# Patient Record
Sex: Female | Born: 1935 | Race: White | Hispanic: No | Marital: Married | Smoking: Never smoker
Health system: Southern US, Community
[De-identification: ages and names within clinical notes are randomized; demographics above are authoritative.]

## PROBLEM LIST (undated history)

## (undated) DIAGNOSIS — J449 Chronic obstructive pulmonary disease, unspecified: Secondary | ICD-10-CM

## (undated) DIAGNOSIS — I209 Angina pectoris, unspecified: Secondary | ICD-10-CM

## (undated) DIAGNOSIS — K219 Gastro-esophageal reflux disease without esophagitis: Secondary | ICD-10-CM

## (undated) DIAGNOSIS — D649 Anemia, unspecified: Secondary | ICD-10-CM

## (undated) HISTORY — DX: Chronic obstructive pulmonary disease, unspecified: J44.9

## (undated) HISTORY — DX: Gastro-esophageal reflux disease without esophagitis: K21.9

---

## 1989-10-09 HISTORY — PX: OTHER SURGICAL HISTORY: SHX169

## 1990-10-09 HISTORY — PX: CHOLECYSTECTOMY: SHX55

## 1993-10-09 HISTORY — PX: OTHER SURGICAL HISTORY: SHX169

## 2006-02-06 ENCOUNTER — Inpatient Hospital Stay: Payer: Self-pay | Admitting: Endocrinology

## 2006-02-06 ENCOUNTER — Other Ambulatory Visit: Payer: Self-pay

## 2006-02-07 ENCOUNTER — Other Ambulatory Visit: Payer: Self-pay

## 2006-03-06 ENCOUNTER — Ambulatory Visit: Payer: Self-pay | Admitting: Internal Medicine

## 2006-06-08 ENCOUNTER — Ambulatory Visit: Payer: Self-pay | Admitting: Internal Medicine

## 2008-01-20 ENCOUNTER — Emergency Department: Payer: Self-pay | Admitting: Emergency Medicine

## 2013-01-10 ENCOUNTER — Ambulatory Visit: Payer: Self-pay | Admitting: Endocrinology

## 2014-04-29 LAB — CBC
HCT: 41.7 % (ref 35.0–47.0)
HGB: 13.4 g/dL (ref 12.0–16.0)
MCH: 29.1 pg (ref 26.0–34.0)
MCHC: 32.1 g/dL (ref 32.0–36.0)
MCV: 91 fL (ref 80–100)
Platelet: 256 10*3/uL (ref 150–440)
RBC: 4.6 10*6/uL (ref 3.80–5.20)
RDW: 16.2 % — AB (ref 11.5–14.5)
WBC: 11.9 10*3/uL — ABNORMAL HIGH (ref 3.6–11.0)

## 2014-04-29 LAB — BASIC METABOLIC PANEL
Anion Gap: 9 (ref 7–16)
BUN: 9 mg/dL (ref 7–18)
Calcium, Total: 9 mg/dL (ref 8.5–10.1)
Chloride: 106 mmol/L (ref 98–107)
Co2: 24 mmol/L (ref 21–32)
Creatinine: 1.24 mg/dL (ref 0.60–1.30)
EGFR (African American): 46 — ABNORMAL LOW
GFR CALC NON AF AMER: 40 — AB
Glucose: 138 mg/dL — ABNORMAL HIGH (ref 65–99)
Osmolality: 278 (ref 275–301)
Potassium: 3.7 mmol/L (ref 3.5–5.1)
Sodium: 139 mmol/L (ref 136–145)

## 2014-04-29 LAB — PRO B NATRIURETIC PEPTIDE: B-Type Natriuretic Peptide: 90 pg/mL (ref 0–450)

## 2014-04-29 LAB — TROPONIN I

## 2014-04-30 ENCOUNTER — Observation Stay: Payer: Self-pay | Admitting: Specialist

## 2014-04-30 LAB — CK TOTAL AND CKMB (NOT AT ARMC)
CK, Total: 24 U/L — ABNORMAL LOW
CK, Total: 27 U/L
CK-MB: 0.6 ng/mL (ref 0.5–3.6)
CK-MB: <0.5 ng/mL — ABNORMAL LOW (ref 0.5–3.6)

## 2014-04-30 LAB — CBC WITH DIFFERENTIAL/PLATELET
Basophil #: 0.1 10*3/uL (ref 0.0–0.1)
Basophil %: 0.6 %
Eosinophil #: 0.2 10*3/uL (ref 0.0–0.7)
Eosinophil %: 1.4 %
HCT: 35.5 % (ref 35.0–47.0)
HGB: 11.6 g/dL — ABNORMAL LOW (ref 12.0–16.0)
LYMPHS ABS: 3.2 10*3/uL (ref 1.0–3.6)
LYMPHS PCT: 27.9 %
MCH: 29.4 pg (ref 26.0–34.0)
MCHC: 32.6 g/dL (ref 32.0–36.0)
MCV: 90 fL (ref 80–100)
MONO ABS: 0.9 x10 3/mm (ref 0.2–0.9)
MONOS PCT: 7.8 %
Neutrophil #: 7.2 10*3/uL — ABNORMAL HIGH (ref 1.4–6.5)
Neutrophil %: 62.3 %
PLATELETS: 225 10*3/uL (ref 150–440)
RBC: 3.93 10*6/uL (ref 3.80–5.20)
RDW: 16.3 % — AB (ref 11.5–14.5)
WBC: 11.5 10*3/uL — ABNORMAL HIGH (ref 3.6–11.0)

## 2014-04-30 LAB — TSH: Thyroid Stimulating Horm: 1.62 u[IU]/mL

## 2014-04-30 LAB — URINALYSIS, COMPLETE
Bilirubin,UR: NEGATIVE
Blood: NEGATIVE
Glucose,UR: NEGATIVE mg/dL (ref 0–75)
Ketone: NEGATIVE
Leukocyte Esterase: NEGATIVE
NITRITE: NEGATIVE
Ph: 5 (ref 4.5–8.0)
Protein: NEGATIVE
RBC,UR: 5 /HPF (ref 0–5)
Squamous Epithelial: 3

## 2014-04-30 LAB — BASIC METABOLIC PANEL
ANION GAP: 7 (ref 7–16)
BUN: 11 mg/dL (ref 7–18)
Calcium, Total: 8.4 mg/dL — ABNORMAL LOW (ref 8.5–10.1)
Chloride: 108 mmol/L — ABNORMAL HIGH (ref 98–107)
Co2: 26 mmol/L (ref 21–32)
Creatinine: 1.16 mg/dL (ref 0.60–1.30)
EGFR (African American): 50 — ABNORMAL LOW
EGFR (Non-African Amer.): 43 — ABNORMAL LOW
GLUCOSE: 114 mg/dL — AB (ref 65–99)
Osmolality: 282 (ref 275–301)
Potassium: 4 mmol/L (ref 3.5–5.1)
Sodium: 141 mmol/L (ref 136–145)

## 2014-04-30 LAB — TROPONIN I: Troponin-I: <0.02 ng/mL

## 2014-04-30 LAB — MAGNESIUM: MAGNESIUM: 1.9 mg/dL

## 2014-10-05 ENCOUNTER — Emergency Department: Payer: Self-pay | Admitting: Emergency Medicine

## 2014-10-06 LAB — COMPREHENSIVE METABOLIC PANEL
ALK PHOS: 68 U/L
ALT: 14 U/L
Albumin: 2.6 g/dL — ABNORMAL LOW (ref 3.4–5.0)
Anion Gap: 7 (ref 7–16)
BUN: 9 mg/dL (ref 7–18)
Bilirubin,Total: 0.5 mg/dL (ref 0.2–1.0)
CREATININE: 0.97 mg/dL (ref 0.60–1.30)
Calcium, Total: 8.2 mg/dL — ABNORMAL LOW (ref 8.5–10.1)
Chloride: 110 mmol/L — ABNORMAL HIGH (ref 98–107)
Co2: 24 mmol/L (ref 21–32)
GFR CALC NON AF AMER: 58 — AB
GLUCOSE: 101 mg/dL — AB (ref 65–99)
Osmolality: 280 (ref 275–301)
POTASSIUM: 3.7 mmol/L (ref 3.5–5.1)
SGOT(AST): 15 U/L (ref 15–37)
Sodium: 141 mmol/L (ref 136–145)
Total Protein: 6.3 g/dL — ABNORMAL LOW (ref 6.4–8.2)

## 2014-10-06 LAB — CBC
HCT: 37.1 % (ref 35.0–47.0)
HGB: 12 g/dL (ref 12.0–16.0)
MCH: 29.2 pg (ref 26.0–34.0)
MCHC: 32.3 g/dL (ref 32.0–36.0)
MCV: 91 fL (ref 80–100)
Platelet: 237 10*3/uL (ref 150–440)
RBC: 4.1 10*6/uL (ref 3.80–5.20)
RDW: 15.3 % — ABNORMAL HIGH (ref 11.5–14.5)
WBC: 10.8 10*3/uL (ref 3.6–11.0)

## 2015-01-30 NOTE — Consult Note (Signed)
PATIENT NAME:  Brittney Trujillo, Maecie L MR#:  [id number] DATE OF BIRTH:  Oct 19, 1926  DATE OF CONSULTATION:  04/30/2014  CONSULTING PHYSICIAN:  Laurier NancyShaukat A. Khan, MD  INDICATION FOR CONSULTATION: Palpitations.   HISTORY OF PRESENT ILLNESS: The patient is an 81 year old white female with a past medical history of hypertension, hyperlipidemia, diabetes mellitus, who presented to the Emergency Room after being seen Dr. Anabel HalonMoriarty's office with palpitations, dizziness, and an episode where she felt like she was going to pass out. She has had this type of episode for the past couple of months and she says even for a few years but lately, the day before yesterday, these symptoms got worse.  She felt like initially that she was having hypoglycemic episodes but the day before yesterday she felt she was going to pass out after having palpitations.   PAST MEDICAL HISTORY: History of COPD, diabetes, hypertension, hyperlipidemia.   SOCIAL HISTORY: No history of EtOH abuse or smoking.   FAMILY HISTORY: Unremarkable.   ALLERGIES: CODEINE.   MEDICATIONS: Aspirin 81 mg, Premarin, Glucotrol, Clarinex, Caduet.    PHYSICAL EXAMINATION:  GENERAL:  Temperature is 98.1, pulse 75, respirations 18, blood pressure is 116/72, with oxygen saturation 94%.  HEENT: No JVD.  LUNGS: Good air entry. No rales or rhonchi.  HEART: Regular rate and rhythm. Normal S1, S2. No audible murmur.  ABDOMEN: Soft, nontender, positive bowel sounds.  EXTREMITIES: No pedal edema.  NEUROLOGIC: The patient appears to be intact.   EKG initially was done at Dr. Anabel HalonMoriarty's office, which showed sinus rhythm with some sinus arrhythmias. EKG done in this hospital showed sinus rhythm, 71 beats per minute, poor R-wave progression, low voltage, nonspecific ST-T changes. Poor R-wave progression suggests old anteroseptal wall MI.    LABORATORY DATA:  BUN 9, creatinine 1.24, BNP is 90, troponin is 0.02. Cardiac enzymes x 3 have been done and they were negative.    ASSESSMENT AND PLAN: The patient had a presyncopal episode with occasional palpitations. She has multiple risk factors for coronary artery disease including hypertension, diabetes, hyperlipidemia. 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 Advise getting an echocardiogram and probably will need outpatient or inpatient Lexiscan Myoview to rule out coronary artery disease. Agree with current treatment that the patient is on: Aspirin, Lipitor 20 mg, Protonix. Echocardiogram is pending.    ____________________________ Laurier NancyShaukat A. Khan, MD sak:lt D: 04/30/2014 09:07:00 ET T: 04/30/2014 09:26:35 ET JOB#: [id number]  cc: Laurier NancyShaukat A. Khan, MD, <Dictator> Laurier NancySHAUKAT A KHAN MD ELECTRONICALLY SIGNED 05/14/2014 12:02

## 2015-01-30 NOTE — H&P (Signed)
PATIENT NAME:  Brittney Trujillo, Brittney Trujillo MR#:  [id number] DATE OF BIRTH:  1926/12/16  DATE OF ADMISSION:  04/30/2014  REFERRING PHYSICIAN:  Dr. Governor Rooksebecca Lord.   PRIMARY CARE PHYSICIAN:  Dr. Patrecia PaceMorayati.   CARDIOLOGY:  The patient had an echo done in the past by Dr. Welton FlakesKhan.   CHIEF COMPLAINT:  Palpitations.   HISTORY OF PRESENT ILLNESS:  This is an 81 year old female with known history of COPD, hypertension, hyperlipidemia and diabetes mellitus, presents with palpitations, the patient reports she had these episodes, very brief, intermittent in the past few months or a few weeks reports, but reports recently they are becoming more frequent, which prompted her to go to see her PCP, as well reports having some occasional episodes of shortness of breath, mainly exertional, at PCP's office the patient had an EKG done which did show her going with intermittent sinus arrhythmia with occasional missing P waves, unclear if this is related to A. Fib or not.  She does not carry a diagnosis of A. Fib in the past, upon presentation in the ED, the patient had a couple of episodes of shortness of breath, where she received albuterol treatment, where she went sinus tachycardic, the patient had CT chest angiogram which was negative for PE, she denies any chest pain, any focal deficits, any altered mental status or confusion.  No nausea.  No vomiting, hospitalist service requested to admit the patient for further evaluation of her palpitations, so far, on telemetry there is no arrhythmias, no A. Fib on her telemetry.  In the ED, she only received albuterol in the Emergency Department.  Her EKG showing inverted T waves in the lateral leads.  There is no EKG at baseline to compare.  The patient denies fever, chills, sweating, chest pain, chest discomfort, hemoptysis, calf tenderness.  Reports her shortness of breath apparently only upon exertion.   PAST MEDICAL HISTORY: 1.  COPD.  2.  Diabetes.  3.  Hypertension.  4.  Hyperlipidemia.    PAST SURGICAL HISTORY:  Hysterectomy.   SOCIAL HISTORY:  The patient lives at home with her husband who is on hospice for cancer.  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  No history of alcohol or illicit drug use.   FAMILY HISTORY:  Denies any history of coronary artery disease in the family.   ALLERGIES:  CODEINE.   HOME MEDICATIONS: 1.  Glucotrol one time a day.  2.  Aspirin 81 mg daily.  3.  Clarinex 5 mg oral daily.  4.  Caduet 1 mg oral daily.  5.  Esomeprazole 40 mg oral twice daily.  6.  Sublingual nitro as needed.  7.  Premarin 0.625 mg oral daily.  8.  Calcium with vitamin D 1 tablet oral daily.   REVIEW OF SYSTEMS: CONSTITUTIONAL:  The patient denies fever, chills.  Reports generalized weakness and fatigue.  EYES:  Denies blurring vision, double vision, inflammation.  EARS, NOSE, THROAT:  Denies tinnitus, ear pain, hearing loss, epistaxis.  RESPIRATORY:  Denies cough, wheezing, hemoptysis.  Reports exertional shortness of breath and COPD.  CARDIOVASCULAR:  Denies chest pain, orthopnea, edema, syncope.  Reports palpitation.  GASTROINTESTINAL:  Denies nausea, vomiting, diarrhea, abdominal pain, hematemesis.  GENITOURINARY:  Denies dysuria, hematuria, renal colic.  ENDOCRINE:  Denies polyuria, polydipsia, heat or cold intolerance.  HEMATOLOGY:  Denies anemia, easy bruising, bleeding diathesis.  INTEGUMENT:  Denies acne, rash or skin lesion.  MUSCULOSKELETAL:  Denies any swelling, gout, arthritis, cramps.  NEUROLOGIC:  Denies CVA, TIA, tremors, migraine headache.  Reports some dizziness.  PSYCHIATRIC:  Denies anxiety, insomnia, or depression.   PHYSICAL EXAMINATION: VITAL SIGNS:  Temperature 97.8, pulse 88, respiratory rate 18, blood pressure 130/92, saturating 97% on room air.  GENERAL:  Well-nourished female who looks comfortable in bed, in no apparent distress.  HEENT:  Head  atraumatic, normocephalic.  Pupils equal, reactive to light.  Pink conjunctivae.  Anicteric sclerae.  Moist oral mucosa.  NECK:  Supple.  No thyromegaly.  No JVD.  CHEST:  Good air entry bilaterally.  No wheezing, rales, rhonchi.  CARDIOVASCULAR:  S1, S2 heard.  No rubs, murmur or gallops.  ABDOMEN:  Soft, nontender, nondistended.  Bowel sounds present.  EXTREMITIES:  No edema.  No clubbing.  No cyanosis.  Pedal pulses +2 bilaterally.  PSYCHIATRIC:  Appropriate affect.  Awake, alert x 3.  Intact judgment and insight.  NEUROLOGIC:  Cranial nerves grossly intact.  Motor 5 out of 5.  No focal deficits.  MUSCULOSKELETAL:  No joint effusion or erythema.   PERTINENT LABORATORY DATA:  CT chest angiogram for PE showing negative for pulmonary embolism or acute cardiopulmonary disease, hiatal hernia, fatty infiltration of the liver and status post cholecystectomy.   ASSESSMENT AND PLAN: 1.  Palpitations, at this point etiology is unclear, her EKG at her primary care office showing occasional sinus arrhythmias with missing P waves, but they are not constant enough to evaluate for atrial fibrillation.  So far, there is no evidence of arrhythmia on EKG.  There is no evidence of arrhythmia on telemetry monitor, the patient will be admitted for further evaluation.  We will admit her to telemetry unit.  We will check echocardiogram.  We will cycle her cardiac enzymes, follow the trend and we will consult cardiology service to see if she will need to have Holter monitor as an outpatient, as well to have them evaluate the EKG at her primary care office as well.  Would hold on starting any beta blockers until she is evaluated by cardiology.  2.  Hypertension.  Blood pressure is acceptable.  Continue with Norvasc.  3.  Chronic obstructive pulmonary disease.  Has no active wheezing.  We will start her on as needed Xopenex.  4.  Hyperlipidemia.  Continue with statin.  5.  Diabetes mellitus.  We will have her on insulin  sliding scale.  6.  Deep vein thrombosis prophylaxis.  SubQ heparin.  7.  CODE STATUS:  THE PATIENT REPORTS SHE IS DO NOT RESUSCITATE.  Reports she had a good life and is the [facility] Poll[facility] is coming to take her, she is ready for it.   Total time spent on admission and patient care 55 minutes.     ____________________________ Starleen Arms, MD dse:ea D: 04/29/2014 23:56:08 ET T: 04/30/2014 00:53:49 ET JOB#: [id number]  cc: Starleen Arms, MD, <Dictator> Jusiah Aguayo Teena Irani MD ELECTRONICALLY SIGNED 05/01/2014 1:31

## 2015-01-30 NOTE — Discharge Summary (Signed)
PATIENT NAME:  Brittney Trujillo, Brittney Trujillo MR#:  [id number] DATE OF BIRTH:  [birth year]  DATE OF ADMISSION:  04/30/2014 DATE OF DISCHARGE:  04/30/2014  For detailed note, please see the history and physical done on admission by Dr.   Randol KernElgergawy.  DIAGNOSES AT DISCHARGE: Chest pain, likely musculoskeletal in nature, palpitations, likely sinus arrhythmia. Hypertension. Hyperlipidemia.   DIET: The patient is being discharged on a low-sodium, low-fat diet.   ACTIVITY: As tolerated.   FOLLOW-UP: With Dr. Adrian BlackwaterShaukat Khan in the next 1 to 2 weeks.    DISCHARGE MEDICATIONS: Sublingual nitroglycerin as needed, Premarin 0.625 mg daily, Nexium 40 mg b.i.d., aspirin 81 mg daily, Clarinex 5 mg daily, calcium and vitamin D 1 tablet daily, Caduet 1 tablet daily.   CONSULTANTS DURING HOSPITAL COURSE: Dr. Adrian BlackwaterShaukat Khan from cardiology.   PERTINENT STUDIES DONE DURING THE HOSPITAL COURSE: CT scan of the chest done with contrast showing negative study for pulmonary embolism, hiatal hernia, fatty infiltration of the liver status post cholecystectomy.   Chest x-ray showing cardiomegaly, no CHF, low lung volumes and mild atelectasis.   HOSPITAL COURSE: This is an 81 year old female who presented to the hospital with palpitations and chest pain.   1. Chest pain. The most likely cause of the patient's chest pain was musculoskeletal in nature as it was reproducible. The patient did have risk factors; therefore, was observed overnight on telemetry. She had 3 sets of cardiac markers checked, which were negative. She continues to have pain, but it is reproducible, and therefore likely musculoskeletal in nature.  2. Palpitations. The patient did have some sinus arrhythmia. The patient was seen by cardiology, who did not think that the patient needed acute intervention. They plan on doing an outpatient echocardiogram and an outpatient stress test coming up next week.  3. Hypertension. The patient remained hemodynamically stable. She will  continue her Caduet.  4. Hyperlipidemia. The patient was maintained on atorvastatin. She will resume that.  5. GERD. The patient was maintained on Protonix. She will continue her Nexium upon discharge.   CODE STATUS: The patient is a Full Code.   TIME SPENT ON DISCHARGE: 35 minutes.    ____________________________ Rolly PancakeVivek J. Cherlynn KaiserSainani, MD vjs:jr D: 04/30/2014 14:56:28 ET T: 04/30/2014 16:44:44 ET JOB#: [id number]  cc: Rolly PancakeVivek J. Cherlynn KaiserSainani, MD, <Dictator> Laurier NancyShaukat A. Khan, MD [place] SirenVIVEK J Sharron Simpson MD ELECTRONICALLY SIGNED 05/07/2014 14:07

## 2015-11-03 ENCOUNTER — Encounter: Payer: Self-pay | Admitting: Speech Pathology

## 2015-11-03 ENCOUNTER — Ambulatory Visit: Payer: Medicare Other | Attending: Otolaryngology | Admitting: Speech Pathology

## 2015-11-03 DIAGNOSIS — R490 Dysphonia: Secondary | ICD-10-CM | POA: Diagnosis not present

## 2015-11-03 NOTE — Therapy (Signed)
[facility] MAIN [facility] SERVICES [address] [place], [place], [address] Phone: [phone]   Fax:  [phone]  Speech Language Pathology Treatment  Patient Details  Name: Brittney Trujillo MRN: [id number] Date of Birth: 1927/08/30 Referring Provider: Dr. Andee Poles  Encounter Date: 11/03/2015      End of Session - 11/03/15 1735    Visit Number 1   Number of Visits 17   Date for SLP Re-Evaluation 01/01/16   SLP Start Time 1500   SLP Stop Time  1550   SLP Time Calculation (min) 50 min   Activity Tolerance Patient tolerated treatment well      Past Medical History  Diagnosis Date   GERD (gastroesophageal reflux disease)    COPD (chronic obstructive pulmonary disease) (HCC)     History reviewed. No pertinent past surgical history.  There were no vitals filed for this visit.  Visit Diagnosis: Dysphonia - Plan: SLP plan of care cert/re-cert      Subjective Assessment - 11/03/15 1734    Subjective Patient reports hoarseness for about 6 months.  She has been evaluated by Dr. 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   Currently in Pain? No/denies           SLP Evaluation [facility] - 11/03/15 0001    SLP Visit Information   SLP Received On 11/03/15   Referring Provider Dr. Andee Poles   Onset Date 10/13/2015   Medical Diagnosis Muscle tension dysphonia   Subjective   Subjective Patient reports hoarseness for about 6 months.  She has been evaluated by Dr. 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   Patient/Family Stated Goal Normal voice   Prior Functional Status   Cognitive/Linguistic Baseline Within functional limits   Oral Motor/Sensory Function   Overall Oral Motor/Sensory Function Appears within functional limits for tasks assessed   Motor Speech   Overall Motor Speech Impaired   Respiration Impaired   Level of Impairment Conversation   Phonation Hoarse;Low vocal intensity   Resonance Within functional limits    Articulation Within functional limitis   Intelligibility Intelligible   Motor Planning Witnin functional limits   Motor Speech Errors Not applicable   Phonation Impaired   Vocal Abuses Habitual Hyperphonia;Habitual Cough/Throat Clear;Glottal Attack;Other (comment)  Low habitual pitch   Tension Present Neck   Volume Soft   Pitch Low   Standardized Assessments   Standardized Assessments  Other Assessment  Perceptual Voice Evaluation      Perceptual Voice Evaluation  Voice history: Patient reports hoarseness for about 6 months.  She has been evaluated by Dr. 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  Voice checklist:  Health risks: GERD/LPR, COPD  Characteristic voice use: talkative  Environmental risks: minimal  Misuse: glottal fry, low pitch, tension   Abuse: throat clearing, coughing  Vocal characteristics: vocal fatigue, limited pitch range, hypophonia, low habitual pitch, hoarseness, strained vocal quality  Patient Quality of Life Survey: Voice Handicap Index-10 Score of 10  A score of 10 or higher indicates perceived handicap  Maximum phonation time for sustained ah: 7 seconds  Average fundamental frequency during sustained ah: 190 (2 STD below average for age and gender)  Average time patient was able to sustain /s/: 8 seconds  Average time patient was able to sustain /z/: 4 seconds  s/z ratio : 2  Visi-Pitch: Multi-Dimensional Voice Program (MDVP)  MDVP extracts objective quantitative values (Relative Average Perturbation, Shimmer, Voice Turbulence Index, and Noise to  Harmonic Ratio) on sustained phonation, which are displayed graphically and numerically in comparison to a built-in normative database.  The patient exhibited values outside the norm for Relative Average Perturbation, Shimmer, Voice Turbulence Index, and Noise to Harmonic Ratio.  Average fundamental frequency was 1.8 STD below the average for age and gender. The patient improved  all parameters when cued to alter voicing (oral resonance).   Stimulability: Improved vocal quality with oral resonance        SLP Education - 2015/11/10 1735    Education provided Yes   Education Details Role of ST in voice therapy   Person(s) Educated Patient;Spouse   Methods Explanation   Comprehension Verbalized understanding            SLP Long Term Goals - 2015-11-10 1738    SLP LONG TERM GOAL #1   Title The patient will demonstrate independent understanding of vocal hygiene concepts and neck, shoulder, lingual stretching exercises.   Time 8   Period Weeks   Status New   SLP LONG TERM GOAL #2   Title The patient will be independent for abdominal breathing and breath support exercises.   Time 8   Period Weeks   Status New   SLP LONG TERM GOAL #3   Title The patient will minimize vocal tension via Yawn-Sigh approach (or comparable technique) with min SLP cues with 80% accuracy.   Time 8   Period Weeks   Status New   SLP LONG TERM GOAL #4   Title The patient will maintain relaxed phonation / oral resonance for paragraph length recitation with 80% accuracy.   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  The patient demonstrates hoarse vocal quality, reduced breath control for speech, strained/tense phonation, limited pitch range, vocal fatigue, and laryngeal tension. She will benefit from voice therapy for education, to improve breath support, improve tone focus, promote easy flow phonation, and learn techniques to increase loudness and pitch range without strain.   Speech Therapy Frequency 2x / week   Duration Other (comment)  8 weeks   Potential to Achieve Goals Good   Potential Considerations Ability to learn/carryover information;Cooperation/participation level;Previous level of function;Family/community support   SLP  Home Exercise Plan To be developed   Consulted and Agree with Plan of Care Patient;Family member/caregiver   Family Member Consulted Spouse          G-Codes - November 10, 2015 1741    Functional Assessment Tool Used Perceptual Vocie Evaluation, clinical judgment   Functional Limitations Voice   Voice Current Status (G9171) At least 40 percent but less than 60 percent impaired, limited or restricted   Voice Goal Status (Z6109) At least 1 percent but less than 20 percent impaired, limited or restricted      Problem List There are no active problems to display for this patient.  Dollene Primrose, MS/CCC- SLP  Leandrew Koyanagi Nov 10, 2015, 5:45 PM  [facility] MAIN [facility] SERVICES [address] [place], [place], [address] Phone: [phone]   Fax:  [phone]   Name: Brittney Trujillo MRN: [id number] Date of Birth: [birth year]

## 2015-11-09 ENCOUNTER — Ambulatory Visit: Payer: Medicare Other | Admitting: Speech Pathology

## 2015-11-09 DIAGNOSIS — R490 Dysphonia: Secondary | ICD-10-CM | POA: Diagnosis not present

## 2015-11-10 ENCOUNTER — Encounter: Payer: Self-pay | Admitting: Speech Pathology

## 2015-11-10 NOTE — Therapy (Signed)
[facility] MAIN [facility] SERVICES [address] [place], [place], [address] Phone: [phone]   Fax:  [phone]  Speech Language Pathology Treatment  Patient Details  Name: Brittney Trujillo MRN: [id number] Date of Birth: 1926/12/29 Referring Provider: Dr. Andee Poles  Encounter Date: 11/09/2015      End of Session - 11/10/15 0831    Visit Number 2   Number of Visits 17   Date for SLP Re-Evaluation 01/01/16   SLP Start Time 1600   SLP Stop Time  1655   SLP Time Calculation (min) 55 min   Activity Tolerance Patient tolerated treatment well      Past Medical History  Diagnosis Date  . GERD (gastroesophageal reflux disease)   . COPD (chronic obstructive pulmonary disease) (HCC)     History reviewed. No pertinent past surgical history.  There were no vitals filed for this visit.  Visit Diagnosis: Dysphonia      Subjective Assessment - 11/10/15 0828    Subjective The patient is eager to improve her vocal quality   Currently in Pain? No/denies               ADULT SLP TREATMENT - 11/10/15 0001    General Information   Behavior/Cognition Alert;Cooperative;Pleasant mood   HPI Muscle tension dysphonia   Treatment Provided   Treatment provided Cognitive-Linquistic   Pain Assessment   Pain Assessment No/denies pain   Cognitive-Linquistic Treatment   Treatment focused on Voice   Skilled Treatment The patient was provided with written and verbal teaching regarding neck, shoulder, tongue, and throat stretches exercises to promote relaxed phonation.  The patient was provided with written and verbal teaching for supplement vocal tract relaxation exercises.  The patient was provided with written and verbal teaching regarding breath support exercises.  Patient instructed in relaxed phonation / oral resonance. Patient not able to maintain relaxed phonation / oral resonance across 4 techniques but was able to achieve better quality voice at least  once per technique.  Patient will continue to attempt these strategies at home when she is less tense and frustrated.     Assessment / Recommendations / Plan   Plan Continue with current plan of care   Progression Toward Goals   Progression toward goals Progressing toward goals          SLP Education - 11/10/15 0828    Education provided Yes   Education Details neck, tongue, and throat stretches, straw phonation, breath support exercises, relaxed phonation/oral resonance   Person(s) Educated Patient;Spouse   Methods Explanation;Demonstration;Verbal cues;Handout   Comprehension Verbalized understanding;Returned demonstration;Verbal cues required;Need further instruction            SLP Long Term Goals - 11/03/15 1738    SLP LONG TERM GOAL #1   Title The patient will demonstrate independent understanding of vocal hygiene concepts and neck, shoulder, lingual stretching exercises.   Time 8   Period Weeks   Status New   SLP LONG TERM GOAL #2   Title The patient will be independent for abdominal breathing and breath support exercises.   Time 8   Period Weeks   Status New   SLP LONG TERM GOAL #3   Title The patient will minimize vocal tension via Yawn-Sigh approach (or comparable technique) with min SLP cues with 80% accuracy.   Time 8   Period Weeks   Status New   SLP LONG TERM GOAL #4   Title The patient will maintain relaxed phonation / oral resonance  for paragraph length recitation with 80% accuracy.   Time 8   Period Weeks   Status New          Plan - 11/10/15 1191    Clinical Impression Statement The patient is able to complete the non-phonatory exercises well.  The patient is having a difficult time finding oral resonance / relaxed phonation and becomes tenser as her frustration increases.     Speech Therapy Frequency 2x / week   Duration Other (comment)   Treatment/Interventions Other (comment)  Voice therapy   Potential to Achieve Goals Good   Potential  Considerations Ability to learn/carryover information;Cooperation/participation level;Previous level of function;Family/community support   SLP Home Exercise Plan neck, tongue, and throat stretches, straw phonation, breath support exercises, relaxed phonation/oral resonance   Consulted and Agree with Plan of Care Patient;Family member/caregiver   Family Member Consulted Spouse        Problem List There are no active problems to display for this patient.  Dollene Primrose, MS/CCC- SLP  Leandrew Koyanagi 11/10/2015, 8:33 AM  [address] [facility] MAIN [facility] SERVICES [address] [place], [place], [address] Phone: [phone]   Fax:  [phone]   Name: Brittney Trujillo MRN: [id number] Date of Birth: [birth year]

## 2015-11-11 ENCOUNTER — Ambulatory Visit: Payer: Medicare Other | Attending: Otolaryngology | Admitting: Speech Pathology

## 2015-11-11 DIAGNOSIS — R490 Dysphonia: Secondary | ICD-10-CM | POA: Diagnosis present

## 2015-11-12 ENCOUNTER — Encounter: Payer: Self-pay | Admitting: Speech Pathology

## 2015-11-12 NOTE — Therapy (Signed)
[facility] MAIN [facility] SERVICES [address] [place], [place], [address] Phone: [phone]   Fax:  [phone]  Speech Language Pathology Treatment  Patient Details  Name: Brittney Trujillo MRN: [id number] Date of Birth: 24-Sep-1927 Referring Provider: Dr. Andee Poles  Encounter Date: 11/11/2015      End of Session - 11/12/15 1053    Visit Number 3   Number of Visits 17   Date for SLP Re-Evaluation 01/01/16   SLP Start Time 1100   SLP Stop Time  1154   SLP Time Calculation (min) 54 min   Activity Tolerance Patient tolerated treatment well      Past Medical History  Diagnosis Date  . GERD (gastroesophageal reflux disease)   . COPD (chronic obstructive pulmonary disease) (HCC)     History reviewed. No pertinent past surgical history.  There were no vitals filed for this visit.  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 No/denies               ADULT SLP TREATMENT - 11/12/15 0001    General Information   Behavior/Cognition Alert;Cooperative;Pleasant mood   HPI Muscle tension dysphonia   Treatment Provided   Treatment provided Cognitive-Linquistic   Pain Assessment   Pain Assessment No/denies pain   Cognitive-Linquistic Treatment   Treatment focused on Voice   Skilled Treatment The patient was provided with written and verbal teaching regarding neck, shoulder, tongue, and throat stretches exercises to promote relaxed phonation.  The patient was provided with written and verbal teaching for supplement vocal tract relaxation exercises.  The patient was provided with written and verbal teaching regarding breath support exercises.  Patient instructed in relaxed phonation / oral resonance. The patient was initially speaking with significantly less tension and  better vocal quality.  She became more and more tense with strained phonation with direct instruction in relaxed phonation / oral resonance.  However she was able to improve vocal quality with model and vocal loudness.  Maintained clear vocal quality with 70% accuracy while generating short phrases in response to simple linguistic task.   Assessment / Recommendations / Plan   Plan Continue with current plan of care   Progression Toward Goals   Progression toward goals Progressing toward goals          SLP Education - 11/12/15 1053    Education provided Yes   Education Details neck, tongue, and throat stretches, straw phonation, breath support exercises, relaxed phonation/oral resonance   Person(s) Educated Patient;Spouse   Methods Explanation;Demonstration;Verbal cues;Handout   Comprehension Verbalized understanding;Returned demonstration;Verbal cues required;Need further instruction            SLP Long Term Goals - 11/03/15 1738    SLP LONG TERM GOAL #1   Title The patient will demonstrate independent understanding of vocal hygiene concepts and neck, shoulder, lingual stretching exercises.   Time 8   Period Weeks   Status New   SLP LONG TERM GOAL #2   Title The patient will be independent for abdominal breathing and breath support exercises.   Time 8   Period Weeks   Status New   SLP LONG TERM GOAL #3   Title The patient will minimize vocal tension via Yawn-Sigh approach (or comparable technique) with min SLP cues with 80% accuracy.   Time  8   Period Weeks   Status New   SLP LONG TERM GOAL #4   Title The patient will maintain relaxed phonation / oral resonance for paragraph length recitation with 80% accuracy.   Time 8   Period Weeks   Status New          Plan - 11/12/15 1054    Clinical Impression Statement The patient is able to complete the non-phonatory exercises well.  The patient is having a difficult time finding oral resonance / relaxed phonation with direct  instruction but was able to achieve better vocal quality with vocal loudness.     Speech Therapy Frequency 2x / week   Duration Other (comment)   Treatment/Interventions Other (comment)  Voice therapy   Potential to Achieve Goals Good   Potential Considerations Ability to learn/carryover information;Cooperation/participation level;Previous level of function;Family/community support   SLP Home Exercise Plan neck, tongue, and throat stretches, straw phonation, breath support exercises, relaxed phonation/oral resonance   Consulted and Agree with Plan of Care Patient;Family member/caregiver   Family Member Consulted Spouse        Problem List There are no active problems to display for this patient.  Dollene Primrose, MS/CCC- SLP  Leandrew Koyanagi 11/12/2015, 10:55 AM  [facility] MAIN [facility] SERVICES [address] [place], [place], [address] Phone: [phone]   Fax:  [phone]   Name: SADIYA DURAND MRN: [id number] Date of Birth: [birth year]

## 2015-11-16 ENCOUNTER — Ambulatory Visit: Payer: Medicare Other | Admitting: Speech Pathology

## 2015-11-16 DIAGNOSIS — R490 Dysphonia: Secondary | ICD-10-CM | POA: Diagnosis not present

## 2015-11-17 ENCOUNTER — Encounter: Payer: Self-pay | Admitting: Speech Pathology

## 2015-11-17 DIAGNOSIS — R490 Dysphonia: Secondary | ICD-10-CM | POA: Diagnosis present

## 2015-11-17 NOTE — Therapy (Signed)
[facility] MAIN [facility] SERVICES [address] [place], [place], [address] Phone: [phone]   Fax:  [phone]  Speech Language Pathology Treatment  Patient Details  Name: Brittney Trujillo MRN: [id number] Date of Birth: 1927-06-25 Referring Provider: Dr. Andee Poles  Encounter Date: 11/16/2015      End of Session - 11/17/15 1318    Visit Number 4   Number of Visits 17   Date for SLP Re-Evaluation 01/01/16   SLP Start Time 1400   SLP Stop Time  1455   SLP Time Calculation (min) 55 min   Activity Tolerance Patient tolerated treatment well      Past Medical History  Diagnosis Date  . GERD (gastroesophageal reflux disease)   . COPD (chronic obstructive pulmonary disease) (HCC)     History reviewed. No pertinent past surgical history.  There were no vitals filed for this visit.  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 No/denies               ADULT SLP TREATMENT - 11/17/15 0001    General Information   Behavior/Cognition Alert;Cooperative;Pleasant mood   HPI Muscle tension dysphonia   Treatment Provided   Treatment provided Cognitive-Linquistic   Pain Assessment   Pain Assessment No/denies pain   Cognitive-Linquistic Treatment   Treatment focused on Voice   Skilled Treatment The patient was provided with written and verbal teaching regarding neck, shoulder, tongue, and throat stretches exercises to promote relaxed phonation.  The patient was provided with written and verbal teaching for supplement vocal tract relaxation exercises.  The patient was provided with written and verbal teaching regarding breath support exercises.  Patient instructed in relaxed phonation / oral resonance. The patient was initially speaking with significantly less tension and  better vocal quality.  She became more and more tense with strained phonation with direct instruction in relaxed phonation / oral resonance.  However she was able to improve vocal quality with model and vocal loudness.  Maintained clear vocal quality with 70% accuracy while generating short phrases in response to simple linguistic task.   Assessment / Recommendations / Plan   Plan Continue with current plan of care   Progression Toward Goals   Progression toward goals Progressing toward goals          SLP Education - 11/17/15 1318    Education provided Yes   Education Details neck, tongue, and throat stretches, straw phonation, breath support exercises, relaxed phonation/oral resonance   Person(s) Educated Patient;Spouse   Methods Explanation   Comprehension Verbalized understanding            SLP Long Term Goals - 11/03/15 1738    SLP LONG TERM GOAL #1   Title The patient will demonstrate independent understanding of vocal hygiene concepts and neck, shoulder, lingual stretching exercises.   Time 8   Period Weeks   Status New   SLP LONG TERM GOAL #2   Title The patient will be independent for abdominal breathing and breath support exercises.   Time 8   Period Weeks   Status New   SLP LONG TERM GOAL #3   Title The patient will minimize vocal tension via Yawn-Sigh approach (or comparable technique) with min SLP cues with 80% accuracy.   Time 8   Period Weeks  Status New   SLP LONG TERM GOAL #4   Title The patient will maintain relaxed phonation / oral resonance for paragraph length recitation with 80% accuracy.   Time 8   Period Weeks   Status New          Plan - 11/17/15 1318    Clinical Impression Statement The patient is able to complete the non-phonatory exercises well.  The patient is having a difficult time finding oral resonance / relaxed phonation with direct instruction but was able to achieve better vocal quality with vocal loudness.     Speech Therapy  Frequency 2x / week   Duration Other (comment)   Treatment/Interventions Other (comment)  Voice therapy   Potential to Achieve Goals Good   Potential Considerations Ability to learn/carryover information;Cooperation/participation level;Previous level of function;Family/community support   SLP Home Exercise Plan neck, tongue, and throat stretches, straw phonation, breath support exercises, relaxed phonation/oral resonance   Consulted and Agree with Plan of Care Patient;Family member/caregiver   Family Member Consulted Spouse        Problem List There are no active problems to display for this patient.  Dollene Primrose, MS/CCC- SLP  Leandrew Koyanagi 11/17/2015, 1:20 PM  [facility] MAIN [facility] SERVICES [address] [place], [place], [address] Phone: [phone]   Fax:  [phone]   Name: Brittney Trujillo MRN: [id number] Date of Birth: 06-21-1927

## 2015-11-18 ENCOUNTER — Ambulatory Visit: Payer: Medicare Other | Admitting: Speech Pathology

## 2015-11-18 DIAGNOSIS — R490 Dysphonia: Secondary | ICD-10-CM | POA: Diagnosis not present

## 2015-11-19 ENCOUNTER — Encounter: Payer: Self-pay | Admitting: Speech Pathology

## 2015-11-19 NOTE — Therapy (Signed)
[facility] MAIN [facility] SERVICES [address] [place], [place], [address] Phone: [phone]   Fax:  [phone]  Speech Language Pathology Treatment  Patient Details  Name: Brittney Trujillo MRN: [id number] Date of Birth: 1927-02-26 Referring Provider: Dr. Andee Poles  Encounter Date: 11/18/2015      End of Session - 11/19/15 0943    Visit Number 5   Number of Visits 17   Date for SLP Re-Evaluation 01/01/16   SLP Start Time 1509   SLP Stop Time  1600   SLP Time Calculation (min) 51 min   Activity Tolerance Patient tolerated treatment well      Past Medical History  Diagnosis Date  . GERD (gastroesophageal reflux disease)   . COPD (chronic obstructive pulmonary disease) (HCC)     History reviewed. No pertinent past surgical history.  There were no vitals filed for this visit.  Visit Diagnosis: Dysphonia      Subjective Assessment - 11/19/15 0942    Subjective Patient reports she is still experiencing difficulty with her voice.   Patient is accompained by: Family member   Currently in Pain? No/denies               ADULT SLP TREATMENT - 11/19/15 0001    General Information   Behavior/Cognition Alert;Cooperative;Pleasant mood   HPI Muscle tension dysphonia   Treatment Provided   Treatment provided Cognitive-Linquistic   Pain Assessment   Pain Assessment No/denies pain   Cognitive-Linquistic Treatment   Treatment focused on Voice   Skilled Treatment Patient was 60% accurate for using a forward, resonant voice with maximum support from the clinician that included cues to elongate nasal sounds, feel a "buzz" on the front of her face, and to keep the voice forward. Patient was able to successfully sustain a fricative for an average of 7 seconds during respiration exercises.   Assessment / Recommendations / Plan   Plan Continue with current plan of care   Progression Toward Goals   Progression toward goals Progressing toward goals          SLP Education - 11/19/15 0943    Education provided Yes   Education Details resonance techniques   Person(s) Educated Patient;Spouse   Methods Explanation;Demonstration   Comprehension Verbalized understanding            SLP Long Term Goals - 11/03/15 1738    SLP LONG TERM GOAL #1   Title The patient will demonstrate independent understanding of vocal hygiene concepts and neck, shoulder, lingual stretching exercises.   Time 8   Period Weeks   Status New   SLP LONG TERM GOAL #2   Title The patient will be independent for abdominal breathing and breath support exercises.   Time 8   Period Weeks   Status New   SLP LONG TERM GOAL #3   Title The patient will minimize vocal tension via Yawn-Sigh approach (or comparable technique) with min SLP cues with 80% accuracy.   Time 8   Period Weeks   Status New   SLP LONG TERM GOAL #4   Title The patient will maintain relaxed phonation / oral resonance for paragraph length recitation with 80% accuracy.   Time 8   Period Weeks   Status New          Plan - 11/19/15 0944    Clinical Impression Statement Patient made progress today with her ability to use a forward, resonant voice. Elongation of nasal sounds proved to be an  effective cue based on clinical judgement and patient report.    Speech Therapy Frequency 2x / week   Duration Other (comment)   Treatment/Interventions Other (comment)   Potential to Achieve Goals Good   Potential Considerations Ability to learn/carryover information;Cooperation/participation level;Previous level of function;Family/community support   SLP Home Exercise Plan vocal resonance exercises, breath support exercises   Consulted and Agree with Plan of Care Patient;Family member/caregiver   Family Member Consulted Spouse        Problem List There are no active problems to display for this patient.   Elsie Stain 11/19/2015, 9:45 AM  [place] MAIN [facility]  SERVICES [address] [place], [place], [address] Phone: [phone]   Fax:  [phone]   Name: Brittney Trujillo MRN: [id number] Date of Birth: Dec 20, 1926

## 2015-11-23 ENCOUNTER — Ambulatory Visit: Payer: Medicare Other | Admitting: Speech Pathology

## 2015-11-25 ENCOUNTER — Ambulatory Visit: Payer: 59 | Admitting: Speech Pathology

## 2015-11-30 ENCOUNTER — Ambulatory Visit: Payer: 59 | Admitting: Speech Pathology

## 2015-12-02 ENCOUNTER — Ambulatory Visit: Payer: 59 | Admitting: Speech Pathology

## 2015-12-07 ENCOUNTER — Ambulatory Visit: Payer: 59 | Admitting: Speech Pathology

## 2015-12-09 ENCOUNTER — Ambulatory Visit: Payer: 59 | Admitting: Speech Pathology

## 2015-12-14 ENCOUNTER — Ambulatory Visit: Payer: 59 | Admitting: Speech Pathology

## 2015-12-16 ENCOUNTER — Ambulatory Visit: Payer: 59 | Admitting: Speech Pathology

## 2015-12-21 ENCOUNTER — Ambulatory Visit: Payer: 59 | Admitting: Speech Pathology

## 2015-12-23 ENCOUNTER — Ambulatory Visit: Payer: 59 | Admitting: Speech Pathology

## 2015-12-28 ENCOUNTER — Ambulatory Visit: Payer: 59 | Admitting: Speech Pathology

## 2015-12-30 ENCOUNTER — Ambulatory Visit: Payer: 59 | Admitting: Speech Pathology

## 2016-02-06 ENCOUNTER — Observation Stay
Admission: EM | Admit: 2016-02-06 | Discharge: 2016-02-06 | Discharge status: Against Medical Advice | Payer: Medicare Other | Attending: Internal Medicine | Admitting: Internal Medicine

## 2016-02-06 ENCOUNTER — Emergency Department: Payer: Medicare Other

## 2016-02-06 DIAGNOSIS — J449 Chronic obstructive pulmonary disease, unspecified: Secondary | ICD-10-CM | POA: Insufficient documentation

## 2016-02-06 DIAGNOSIS — R0789 Other chest pain: Principal | ICD-10-CM | POA: Insufficient documentation

## 2016-02-06 DIAGNOSIS — Z7901 Long term (current) use of anticoagulants: Secondary | ICD-10-CM | POA: Diagnosis not present

## 2016-02-06 DIAGNOSIS — E119 Type 2 diabetes mellitus without complications: Secondary | ICD-10-CM | POA: Diagnosis not present

## 2016-02-06 DIAGNOSIS — Z79899 Other long term (current) drug therapy: Secondary | ICD-10-CM | POA: Insufficient documentation

## 2016-02-06 DIAGNOSIS — I10 Essential (primary) hypertension: Secondary | ICD-10-CM | POA: Insufficient documentation

## 2016-02-06 DIAGNOSIS — I480 Paroxysmal atrial fibrillation: Secondary | ICD-10-CM | POA: Diagnosis not present

## 2016-02-06 DIAGNOSIS — E871 Hypo-osmolality and hyponatremia: Secondary | ICD-10-CM | POA: Insufficient documentation

## 2016-02-06 DIAGNOSIS — Z8673 Personal history of transient ischemic attack (TIA), and cerebral infarction without residual deficits: Secondary | ICD-10-CM | POA: Insufficient documentation

## 2016-02-06 DIAGNOSIS — K219 Gastro-esophageal reflux disease without esophagitis: Secondary | ICD-10-CM | POA: Insufficient documentation

## 2016-02-06 DIAGNOSIS — R079 Chest pain, unspecified: Secondary | ICD-10-CM

## 2016-02-06 LAB — CBC
HEMATOCRIT: 33.4 % — AB (ref 35.0–47.0)
HEMOGLOBIN: 11.2 g/dL — AB (ref 12.0–16.0)
MCH: 29.6 pg (ref 26.0–34.0)
MCHC: 33.5 g/dL (ref 32.0–36.0)
MCV: 88.5 fL (ref 80.0–100.0)
Platelets: 213 10*3/uL (ref 150–440)
RBC: 3.78 MIL/uL — AB (ref 3.80–5.20)
RDW: 15.8 % — ABNORMAL HIGH (ref 11.5–14.5)
WBC: 8.6 10*3/uL (ref 3.6–11.0)

## 2016-02-06 LAB — BASIC METABOLIC PANEL
Anion gap: 8 (ref 5–15)
BUN: 16 mg/dL (ref 6–20)
CO2: 21 mmol/L — ABNORMAL LOW (ref 22–32)
Calcium: 8.6 mg/dL — ABNORMAL LOW (ref 8.9–10.3)
Chloride: 113 mmol/L — ABNORMAL HIGH (ref 101–111)
Creatinine, Ser: 1.04 mg/dL — ABNORMAL HIGH (ref 0.44–1.00)
GFR calc Af Amer: 55 mL/min — ABNORMAL LOW (ref >60)
GFR calc non Af Amer: 47 mL/min — ABNORMAL LOW (ref >60)
Glucose, Bld: 99 mg/dL (ref 65–99)
Potassium: 3.5 mmol/L (ref 3.5–5.1)
Sodium: 142 mmol/L (ref 135–145)

## 2016-02-06 LAB — TROPONIN I: Troponin I: <0.03 ng/mL (ref <0.031)

## 2016-02-06 MED ORDER — APIXABAN 5 MG PO TABS: 5.0000 mg | ORAL_TABLET | Freq: Two times a day (BID) | ORAL | Status: DC

## 2016-02-06 MED ORDER — TRAZODONE HCL 50 MG PO TABS: 25.0000 mg | ORAL_TABLET | Freq: Every evening | ORAL | Status: DC | PRN

## 2016-02-06 MED ORDER — ONDANSETRON HCL 4 MG/2ML IJ SOLN: 4.0000 mg | Freq: Four times a day (QID) | INTRAMUSCULAR | Status: DC | PRN

## 2016-02-06 MED ORDER — SODIUM CHLORIDE 0.9 % IV SOLN: INTRAVENOUS | Status: DC

## 2016-02-06 MED ORDER — LORATADINE 10 MG PO TABS: 10.0000 mg | ORAL_TABLET | Freq: Every day | ORAL | Status: DC

## 2016-02-06 MED ORDER — SODIUM CHLORIDE 0.9 % IV BOLUS (SEPSIS)
500.0000 mL | Freq: Once | INTRAVENOUS | Status: AC
  Administered 2016-02-06: 500 mL via INTRAVENOUS

## 2016-02-06 MED ORDER — CALCIUM CARBONATE-VITAMIN D 600-400 MG-UNIT PO TABS: 1.0000 | ORAL_TABLET | Freq: Two times a day (BID) | ORAL | Status: DC

## 2016-02-06 MED ORDER — NITROGLYCERIN 2 % TD OINT: 0.5000 [in_us] | TOPICAL_OINTMENT | Freq: Three times a day (TID) | TRANSDERMAL | Status: DC

## 2016-02-06 MED ORDER — PANTOPRAZOLE SODIUM 40 MG PO TBEC: 40.0000 mg | DELAYED_RELEASE_TABLET | Freq: Every day | ORAL | Status: DC

## 2016-02-06 MED ORDER — IOPAMIDOL (ISOVUE-370) INJECTION 76%
75.0000 mL | Freq: Once | INTRAVENOUS | Status: AC | PRN
  Administered 2016-02-06: 75 mL via INTRAVENOUS

## 2016-02-06 MED ORDER — ONDANSETRON HCL 4 MG PO TABS: 4.0000 mg | ORAL_TABLET | Freq: Four times a day (QID) | ORAL | Status: DC | PRN

## 2016-02-06 MED ORDER — BISACODYL 5 MG PO TBEC: 5.0000 mg | DELAYED_RELEASE_TABLET | Freq: Every day | ORAL | Status: DC | PRN

## 2016-02-06 MED ORDER — FLUOXETINE HCL 10 MG PO CAPS: 10.0000 mg | ORAL_CAPSULE | ORAL | Status: DC

## 2016-02-06 MED ORDER — DENOSUMAB 60 MG/ML ~~LOC~~ SOLN: 60.0000 mg | SUBCUTANEOUS | Status: DC

## 2016-02-06 MED ORDER — DILTIAZEM HCL ER 120 MG PO CP24: 120.0000 mg | ORAL_CAPSULE | Freq: Every day | ORAL | Status: DC

## 2016-02-06 MED ORDER — SIMVASTATIN 40 MG PO TABS: 40.0000 mg | ORAL_TABLET | Freq: Every day | ORAL | Status: DC

## 2016-02-06 MED ORDER — ESTROGENS CONJUGATED 0.625 MG PO TABS: 0.6250 mg | ORAL_TABLET | Freq: Every day | ORAL | Status: DC

## 2016-02-06 MED ORDER — DOCUSATE SODIUM 100 MG PO CAPS: 100.0000 mg | ORAL_CAPSULE | Freq: Two times a day (BID) | ORAL | Status: DC

## 2016-02-06 MED ORDER — HEPARIN SODIUM (PORCINE) 5000 UNIT/ML IJ SOLN: 5000.0000 [IU] | Freq: Three times a day (TID) | INTRAMUSCULAR | Status: DC

## 2016-02-06 MED ORDER — ACETAMINOPHEN 325 MG PO TABS: 650.0000 mg | ORAL_TABLET | Freq: Four times a day (QID) | ORAL | Status: DC | PRN

## 2016-02-06 MED ORDER — ASPIRIN 81 MG PO CHEW
324.0000 mg | CHEWABLE_TABLET | Freq: Once | ORAL | Status: DC
  Filled 2016-02-06: qty 4

## 2016-02-06 NOTE — ED Provider Notes (Addendum)
[facility] Emergency Department Provider Note   ____________________________________________  Time seen: Approximately 12:23 PM  I have reviewed the triage vital signs and the nursing notes.   HISTORY  Chief Complaint Chest Pain    HPI Brittney Trujillo is a 82 y.o. female with history of type 2 diabetes, paroxysmal atrial fibrillation on elliquis, hypertension, hyponatremia, TIA who presents for evaluation of substernal chest discomfort at rest today, gradual onset but now resolved after she took 3 sublingual nitroglycerin. Patient reports that she was at home when she developed severe chest is comfort that felt like it was moving towards her back. She clutched her chest, after taking 3 nitroglycerin tablets it resolved. It was not associated with exertion, wasn't associated with some shortness of breath. No nausea or vomiting. He reports he had this same discomfort a few months ago but did not call EMS. [facility] and was told that if she had recurrence of chest pain she would likely need a stress test, she has not undergone any cardiac stress testing as of late. No cough, vomiting, diarrhea, fevers or chills.   Past Medical History  Diagnosis Date  . GERD (gastroesophageal reflux disease)   . COPD (chronic obstructive pulmonary disease) [facility](HCC)     Patient Active Problem List   Diagnosis Date Noted  . Chest pain 02/06/2016    History reviewed. No pertinent past surgical history.  Current Outpatient Rx  Name  Route  Sig  Dispense  Refill  . apixaban (ELIQUIS) 5 MG TABS tablet   Oral   Take 5 mg by mouth 2 (two) times daily.         . Calcium Carbonate-Vitamin D 600-400 MG-UNIT tablet   Oral   Take 1 tablet by mouth 2 (two) times daily.         Marland Kitchen. denosumab (PROLIA) 60 MG/ML SOLN injection   Subcutaneous   Inject 60 mg into the skin every 6 (six) months.         . desloratadine (CLARINEX) 5 MG tablet   Oral   Take 5 mg by mouth daily.         Marland Kitchen. diltiazem (DILACOR XR) 120 MG 24 hr capsule   Oral   Take 120 mg by mouth daily.         Marland Kitchen. estrogens, conjugated, (PREMARIN) 0.625 MG tablet   Oral   Take 0.625 mg by mouth daily.          Marland Kitchen. FLUoxetine (PROZAC) 10 MG capsule   Oral   Take 10 mg by mouth every morning.         . nitroGLYCERIN (NITROSTAT) 0.4 MG SL tablet   Sublingual   Place 0.4 mg under the tongue every 5 (five) minutes as needed for chest pain.         Marland Kitchen. omeprazole (PRILOSEC) 20 MG capsule   Oral   Take 20 mg by mouth daily.         . simvastatin (ZOCOR) 40 MG tablet   Oral   Take 40 mg by mouth at bedtime.           Allergies Albuterol and Codeine  History reviewed. No pertinent family history.  Social History Social History  Substance Use Topics  . Smoking status: Never Smoker   . Smokeless tobacco: None  . Alcohol Use: None    Review of Systems Constitutional: No fever/chills Eyes: No visual changes. ENT: No sore throat. Cardiovascular: +  chest pain. Respiratory: +shortness of breath. Gastrointestinal: No abdominal pain.  No nausea, no vomiting.  No diarrhea.  No constipation. Genitourinary: Negative for dysuria. Musculoskeletal: Negative for back pain. Skin: Negative for rash. Neurological: Negative for headaches, focal weakness or numbness.  10-point ROS otherwise negative.  ____________________________________________   PHYSICAL EXAM:  VITAL SIGNS: ED Triage Vitals  Enc Vitals Group     BP 02/06/16 1129 101/53 mmHg     Pulse Rate 02/06/16 1129 62     Resp 02/06/16 1129 16     Temp 02/06/16 1129 97.7 F (36.5 C)     Temp Source 02/06/16 1129 Oral     SpO2 02/06/16 1129 97 %     Weight 02/06/16 1129 155 lb (70.308 kg)     Height 02/06/16 1129  (1.651 m)     Head Cir --      Peak Flow --      Pain Score --      Pain Loc --      Pain Edu? --      Excl. in GC? --     Constitutional: Alert and oriented.  Nontoxic-appearing and in no acute distress. Eyes: Conjunctivae are normal. PERRL. EOMI. Head: Atraumatic. Nose: No congestion/rhinnorhea. Mouth/Throat: Mucous membranes are moist.  Oropharynx non-erythematous. Neck: No stridor.  Supple without meningismus. Cardiovascular: Normal rate, regular rhythm. Grossly normal heart sounds.  Good peripheral circulation. Respiratory: Normal respiratory effort.  No retractions. Lungs CTAB. Gastrointestinal: Soft and nontender. No distention.  No CVA tenderness. Genitourinary: deferred Musculoskeletal: No lower extremity tenderness nor edema.  No joint effusions. Neurologic:  Normal speech and language. No gross focal neurologic deficits are appreciated.  Skin:  Skin is warm, dry and intact. No rash noted. Psychiatric: Mood and affect are normal. Speech and behavior are normal.  ____________________________________________   LABS (all labs ordered are listed, but only abnormal results are displayed)  Labs Reviewed  BASIC METABOLIC PANEL - Abnormal; Notable for the following:    Chloride 113 (*)    CO2 21 (*)    Creatinine, Ser 1.04 (*)    Calcium 8.6 (*)    GFR calc non Af Amer 47 (*)    GFR calc Af Amer 55 (*)    All other components within normal limits  CBC - Abnormal; Notable for the following:    RBC 3.78 (*)    Hemoglobin 11.2 (*)    HCT 33.4 (*)    RDW 15.8 (*)    All other components within normal limits  TROPONIN I  TROPONIN I  TROPONIN I  TROPONIN I   ____________________________________________  EKG  ED ECG REPORT I, Gayla Doss, the attending physician, personally viewed and interpreted this ECG.   Date: 02/06/2016  EKG Time: 12:17  Rate: 55  Rhythm: sinus bradycardia  Axis: normal  Intervals:none  ST&T Change: No acute ST elevation. No acute ST depression. Borderline T-wave abnormalities in the lateral leads. When compared to the EKG on 722/2015, the exams are  similar.  ____________________________________________  RADIOLOGY  CXR IMPRESSION: 1. No acute cardiopulmonary abnormalities.   CTA chest IMPRESSION: No evidence of aortic dissection.  No evidence of pulmonary thromboembolism.  Hiatal hernia.  ____________________________________________   PROCEDURES  Procedure(s) performed: None  Critical Care performed: No  ____________________________________________   INITIAL IMPRESSION / ASSESSMENT AND PLAN / ED COURSE  Pertinent labs & imaging results that were available during my care of the patient were reviewed by me and considered in my medical decision  making (see chart for details).  Callie Facey is a 82 y.o. female with history of type 2 diabetes, paroxysmal atrial fibrillation on elliquis, hypertension, hyponatremia, TIA who presents for evaluation of substernal chest discomfort at rest today. On exam, she is well-appearing and in no acute distress. Her vital signs are stable, she is afebrile. She is currently chest pain-free. Her EKG is unchanged from prior. CBC is notable for mild anemia. BMP with mild creatinine elevation at 1.04, mildly hyperchloremic. Will give light IV fluids. Chest x-ray clear. Her initial troponin is negative however given her complaints of chest pain only responsive to several nitroglycerin, given multiple risk factors for ACS, my concern is for unstable angina. Additionally, she has had no recent provocative testing. Will obtain CTA of her chest to rule out dissection given that her pain at one point was radiating to her back. Anticipate admission.  ----------------------------------------- 2:17 PM on 02/06/2016 ----------------------------------------- CTA chest negative for dissection or acute PE. Case discussed with the hospitalist, Dr. Luberta Mutter, for admission at this time.   ----------------------------------------- 3:55 PM on 02/06/2016 ----------------------------------------- Patient  was seen by the hospitalist however she has changed her mind about admission at this time. She desires to leave the hospital immediately. I discussed with her that she is leaving AGAINST MEDICAL ADVICE. I discussed with her that she is putting herself at risk for a heart attack, failure, sudden  death, loss of current lifestyle. She reports to me "I know that but I just need to go home... If the good Lord wants to take me he will". Her husband is at the bedside witnessing this interaction and is in agreement with her decision to leave AGAINST MEDICAL ADVICE. She signed AMA paperwork. I encouraged her to follow-ip with her cardiologist, additionally she knows that she should return at any time if she changes her mind and desires treatment or additional testing.    ____________________________________________   FINAL CLINICAL IMPRESSION(S) / ED DIAGNOSES  Final diagnoses:  Chest pain, unspecified chest pain type      NEW MEDICATIONS STARTED DURING THIS VISIT:  New Prescriptions   No medications on file     Note:  This document was prepared using Dragon voice recognition software and may include unintentional dictation errors.    Gayla Doss, MD 02/06/16 1418  Gayla Doss, MD 02/06/16 [phone]

## 2016-02-06 NOTE — ED Notes (Signed)
Patient transported to CT 

## 2016-02-06 NOTE — ED Notes (Signed)
Pt presents via EMS c/o chest pain starting at 1000 this am per pt report. Pt given nitro SL x3 in route by EMS. Pt states chest pain free at this time. States has hx hiatal hernia and feels pressure from it. A& in NAD at this time.

## 2016-02-06 NOTE — ED Notes (Signed)
Patient transported to X-ray 

## 2016-05-17 ENCOUNTER — Other Ambulatory Visit: Payer: Self-pay | Admitting: Endocrinology

## 2016-05-17 DIAGNOSIS — R079 Chest pain, unspecified: Secondary | ICD-10-CM

## 2016-05-23 ENCOUNTER — Ambulatory Visit
Admission: RE | Admit: 2016-05-23 | Discharge: 2016-05-23 | Disposition: A | Discharge status: Home / Self Care | Payer: Medicare Other | Source: Ambulatory Visit | Attending: Endocrinology | Admitting: Endocrinology

## 2016-05-23 DIAGNOSIS — R079 Chest pain, unspecified: Secondary | ICD-10-CM | POA: Diagnosis present

## 2016-05-23 DIAGNOSIS — K219 Gastro-esophageal reflux disease without esophagitis: Secondary | ICD-10-CM | POA: Diagnosis not present

## 2016-05-23 DIAGNOSIS — I351 Nonrheumatic aortic (valve) insufficiency: Secondary | ICD-10-CM | POA: Diagnosis not present

## 2016-05-23 DIAGNOSIS — I340 Nonrheumatic mitral (valve) insufficiency: Secondary | ICD-10-CM | POA: Insufficient documentation

## 2016-05-23 DIAGNOSIS — J449 Chronic obstructive pulmonary disease, unspecified: Secondary | ICD-10-CM | POA: Insufficient documentation

## 2016-05-23 NOTE — Progress Notes (Signed)
*  PRELIMINARY RESULTS* Echocardiogram 2D Echocardiogram has been performed.  Brittney Trujillo, Brittney Trujillo 05/23/2016, 11:48 AM

## 2016-12-27 ENCOUNTER — Emergency Department: Payer: Medicare Other

## 2016-12-27 ENCOUNTER — Inpatient Hospital Stay
Admission: EM | Admit: 2016-12-27 | Discharge: 2016-12-29 | DRG: 379 | Disposition: A | Discharge status: Home / Self Care | Payer: Medicare Other | Attending: Internal Medicine | Admitting: Internal Medicine

## 2016-12-27 DIAGNOSIS — R531 Weakness: Secondary | ICD-10-CM | POA: Diagnosis not present

## 2016-12-27 DIAGNOSIS — Z7901 Long term (current) use of anticoagulants: Secondary | ICD-10-CM

## 2016-12-27 DIAGNOSIS — J449 Chronic obstructive pulmonary disease, unspecified: Secondary | ICD-10-CM | POA: Diagnosis present

## 2016-12-27 DIAGNOSIS — E785 Hyperlipidemia, unspecified: Secondary | ICD-10-CM | POA: Diagnosis present

## 2016-12-27 DIAGNOSIS — K922 Gastrointestinal hemorrhage, unspecified: Principal | ICD-10-CM | POA: Diagnosis present

## 2016-12-27 DIAGNOSIS — E119 Type 2 diabetes mellitus without complications: Secondary | ICD-10-CM | POA: Diagnosis present

## 2016-12-27 DIAGNOSIS — Z888 Allergy status to other drugs, medicaments and biological substances status: Secondary | ICD-10-CM

## 2016-12-27 DIAGNOSIS — Z79899 Other long term (current) drug therapy: Secondary | ICD-10-CM

## 2016-12-27 DIAGNOSIS — Z8673 Personal history of transient ischemic attack (TIA), and cerebral infarction without residual deficits: Secondary | ICD-10-CM

## 2016-12-27 DIAGNOSIS — Z823 Family history of stroke: Secondary | ICD-10-CM

## 2016-12-27 DIAGNOSIS — I482 Chronic atrial fibrillation: Secondary | ICD-10-CM | POA: Diagnosis present

## 2016-12-27 DIAGNOSIS — D649 Anemia, unspecified: Secondary | ICD-10-CM

## 2016-12-27 DIAGNOSIS — K219 Gastro-esophageal reflux disease without esophagitis: Secondary | ICD-10-CM | POA: Diagnosis present

## 2016-12-27 DIAGNOSIS — I25119 Atherosclerotic heart disease of native coronary artery with unspecified angina pectoris: Secondary | ICD-10-CM | POA: Diagnosis present

## 2016-12-27 DIAGNOSIS — D509 Iron deficiency anemia, unspecified: Secondary | ICD-10-CM | POA: Diagnosis present

## 2016-12-27 DIAGNOSIS — Z90710 Acquired absence of both cervix and uterus: Secondary | ICD-10-CM

## 2016-12-27 DIAGNOSIS — K5730 Diverticulosis of large intestine without perforation or abscess without bleeding: Secondary | ICD-10-CM | POA: Diagnosis present

## 2016-12-27 DIAGNOSIS — R195 Other fecal abnormalities: Secondary | ICD-10-CM

## 2016-12-27 DIAGNOSIS — F329 Major depressive disorder, single episode, unspecified: Secondary | ICD-10-CM | POA: Diagnosis present

## 2016-12-27 DIAGNOSIS — Z9049 Acquired absence of other specified parts of digestive tract: Secondary | ICD-10-CM

## 2016-12-27 DIAGNOSIS — Z885 Allergy status to narcotic agent status: Secondary | ICD-10-CM

## 2016-12-27 HISTORY — DX: Angina pectoris, unspecified: I20.9

## 2016-12-27 HISTORY — DX: Anemia, unspecified: D64.9

## 2016-12-27 LAB — COMPREHENSIVE METABOLIC PANEL
ALBUMIN: 3.4 g/dL — AB (ref 3.5–5.0)
ALK PHOS: 46 U/L (ref 38–126)
ALT: 12 U/L — ABNORMAL LOW (ref 14–54)
ANION GAP: 8 (ref 5–15)
AST: 25 U/L (ref 15–41)
BUN: 12 mg/dL (ref 6–20)
CALCIUM: 8.7 mg/dL — AB (ref 8.9–10.3)
CHLORIDE: 106 mmol/L (ref 101–111)
CO2: 23 mmol/L (ref 22–32)
Creatinine, Ser: 1.09 mg/dL — ABNORMAL HIGH (ref 0.44–1.00)
GFR calc Af Amer: 51 mL/min — ABNORMAL LOW (ref >60)
GFR calc non Af Amer: 44 mL/min — ABNORMAL LOW (ref >60)
Glucose, Bld: 102 mg/dL — ABNORMAL HIGH (ref 65–99)
Potassium: 4.3 mmol/L (ref 3.5–5.1)
SODIUM: 137 mmol/L (ref 135–145)
Total Bilirubin: 0.9 mg/dL (ref 0.3–1.2)
Total Protein: 7.1 g/dL (ref 6.5–8.1)

## 2016-12-27 LAB — CBC WITH DIFFERENTIAL/PLATELET
Basophils Absolute: 0.1 10*3/uL (ref 0–0.1)
Basophils Relative: 1 %
EOS ABS: 0.2 10*3/uL (ref 0–0.7)
Eosinophils Relative: 2 %
HCT: 28.4 % — ABNORMAL LOW (ref 35.0–47.0)
Hemoglobin: 9.2 g/dL — ABNORMAL LOW (ref 12.0–16.0)
Lymphocytes Relative: 26 %
Lymphs Abs: 2.6 10*3/uL (ref 1.0–3.6)
MCH: 25.2 pg — ABNORMAL LOW (ref 26.0–34.0)
MCHC: 32.6 g/dL (ref 32.0–36.0)
MCV: 77.4 fL — AB (ref 80.0–100.0)
MONO ABS: 0.6 10*3/uL (ref 0.2–0.9)
MONOS PCT: 6 %
Neutro Abs: 6.5 10*3/uL (ref 1.4–6.5)
Neutrophils Relative %: 65 %
Platelets: 321 10*3/uL (ref 150–440)
RBC: 3.67 MIL/uL — ABNORMAL LOW (ref 3.80–5.20)
RDW: 17.3 % — ABNORMAL HIGH (ref 11.5–14.5)
WBC: 9.9 10*3/uL (ref 3.6–11.0)

## 2016-12-27 LAB — TROPONIN I: Troponin I: <0.03 ng/mL (ref <0.03)

## 2016-12-27 MED ORDER — ONDANSETRON HCL 4 MG/2ML IJ SOLN: 4.0000 mg | Freq: Four times a day (QID) | INTRAMUSCULAR | Status: DC | PRN

## 2016-12-27 MED ORDER — PANTOPRAZOLE SODIUM 40 MG PO TBEC
40.0000 mg | DELAYED_RELEASE_TABLET | Freq: Two times a day (BID) | ORAL | Status: DC
  Administered 2016-12-28 – 2016-12-29 (×2): 40 mg via ORAL
  Filled 2016-12-27 (×4): qty 1

## 2016-12-27 MED ORDER — ALPRAZOLAM 0.5 MG PO TABS
0.5000 mg | ORAL_TABLET | Freq: Once | ORAL | Status: AC
  Administered 2016-12-27: 0.5 mg via ORAL
  Filled 2016-12-27: qty 1

## 2016-12-27 MED ORDER — FLUOXETINE HCL 10 MG PO CAPS
10.0000 mg | ORAL_CAPSULE | Freq: Every day | ORAL | Status: DC
  Administered 2016-12-27 – 2016-12-29 (×3): 10 mg via ORAL
  Filled 2016-12-27 (×3): qty 1

## 2016-12-27 MED ORDER — CALCIUM CARBONATE-VITAMIN D 500-200 MG-UNIT PO TABS
1.0000 | ORAL_TABLET | Freq: Two times a day (BID) | ORAL | Status: DC
  Administered 2016-12-27 – 2016-12-29 (×4): 1 via ORAL
  Filled 2016-12-27 (×4): qty 1

## 2016-12-27 MED ORDER — ONDANSETRON HCL 4 MG PO TABS: 4.0000 mg | ORAL_TABLET | Freq: Four times a day (QID) | ORAL | Status: DC | PRN

## 2016-12-27 MED ORDER — NITROGLYCERIN 0.4 MG SL SUBL: 0.4000 mg | SUBLINGUAL_TABLET | SUBLINGUAL | Status: DC | PRN

## 2016-12-27 MED ORDER — ACETAMINOPHEN 650 MG RE SUPP: 650.0000 mg | Freq: Four times a day (QID) | RECTAL | Status: DC | PRN

## 2016-12-27 MED ORDER — DILTIAZEM HCL ER COATED BEADS 120 MG PO CP24
120.0000 mg | ORAL_CAPSULE | Freq: Every day | ORAL | Status: DC
  Administered 2016-12-27 – 2016-12-29 (×3): 120 mg via ORAL
  Filled 2016-12-27 (×4): qty 1

## 2016-12-27 MED ORDER — ESTROGENS CONJUGATED 0.625 MG PO TABS
0.6250 mg | ORAL_TABLET | Freq: Every day | ORAL | Status: DC
  Administered 2016-12-27 – 2016-12-29 (×3): 0.625 mg via ORAL
  Filled 2016-12-27 (×3): qty 1

## 2016-12-27 MED ORDER — SIMVASTATIN 20 MG PO TABS
40.0000 mg | ORAL_TABLET | Freq: Every day | ORAL | Status: DC
  Administered 2016-12-27: 40 mg via ORAL
  Filled 2016-12-27: qty 4
  Filled 2016-12-27: qty 2

## 2016-12-27 MED ORDER — ACETAMINOPHEN 325 MG PO TABS
650.0000 mg | ORAL_TABLET | Freq: Four times a day (QID) | ORAL | Status: DC | PRN
  Administered 2016-12-27: 17:00:00 650 mg via ORAL
  Filled 2016-12-27: qty 2

## 2016-12-27 NOTE — ED Triage Notes (Signed)
[facility] for further eval of low HGB and low iron. PCP states pt would not let him perform a rectal exam to check for rectal bleeding.

## 2016-12-27 NOTE — ED Notes (Signed)
Responded to pt's "call bell", and while in the room asked questions regarding notification of her PCP about our plans for her care here, how long it will take to get her admitted, and can she have food. Dr Mayford KnifeWilliams granted permission for pt to eat. I returned to pt's room with a lunch box of Mal[place]urkey sandwich, applesauce and graham crackers. Pt sated "I'd rather starve than eat that food". Pt wasn't interested in any other food choices.

## 2016-12-27 NOTE — Progress Notes (Signed)
Chaplain received a page to visit with pt in room 123. Provided information for an advanced directive.    12/27/16 1658  Clinical Encounter Type  Visited With Patient;Patient and family together  Visit Type Initial  Referral From Nurse  Consult/Referral To Chaplain  Spiritual Encounters  Spiritual Needs Other (Comment)

## 2016-12-27 NOTE — ED Provider Notes (Signed)
[facility] Emergency Department Provider Note        Time seen: ----------------------------------------- 11:43 AM on 12/27/2016 -----------------------------------------    I have reviewed the triage vital signs and the nursing notes.   HISTORY  Chief Complaint Fatigue and Abnormal Lab    HPI Brittney Trujillo is a 83 y.o. female who presents to the ER for possible low hemoglobin and low iron. Primary care doctor states she would not let him before rectal exam to check for occult bleeding. Patient does describe weakness and shortness of breath with exertion. She denies recent illness or other complaints. Symptoms have been persistent for several days.   Past Medical History:  Diagnosis Date  . Anemia   . Anginal pain (HCC)   . COPD (chronic obstructive pulmonary disease) (HCC)   . GERD (gastroesophageal reflux disease)     Patient Active Problem List   Diagnosis Date Noted  . Chest pain 02/06/2016    No past surgical history on file.  Allergies Albuterol and Codeine  Social History Social History  Substance Use Topics  . Smoking status: Never Smoker  . Smokeless tobacco: Not on file  . Alcohol use Not on file    Review of Systems Constitutional: Negative for fever. Cardiovascular: Negative for chest pain. Respiratory: Positive for shortness of breath Gastrointestinal: Negative for abdominal pain, vomiting and diarrhea. Genitourinary: Negative for dysuria. Musculoskeletal: Negative for back pain. Skin: Negative for rash. Neurological: Positive for weakness  10-point ROS otherwise negative.  ____________________________________________   PHYSICAL EXAM:  VITAL SIGNS: ED Triage Vitals [12/27/16 1121]  Enc Vitals Group     BP (!) 108/57     Pulse Rate 68     Resp 16     Temp 97.6 F (36.4 C)     Temp Source Oral     SpO2 100 %     Weight 160 lb (72.6 kg)     Height 5\' 5"  (1.651 m)     Head Circumference      Peak Flow       Pain Score      Pain Loc      Pain Edu?      Excl. in GC?     Constitutional: Alert and oriented. Well appearing and in no distress. Eyes: Conjunctivae are normal. PERRL. Normal extraocular movements. ENT   Head: Normocephalic and atraumatic.   Nose: No congestion/rhinnorhea.   Mouth/Throat: Mucous membranes are moist.   Neck: No stridor. Cardiovascular: Normal rate, regular rhythm. No murmurs, rubs, or gallops. Respiratory: Normal respiratory effort without tachypnea nor retractions. Breath sounds are clear and equal bilaterally. No wheezes/rales/rhonchi. Gastrointestinal: Soft and nontender. Normal bowel sounds Rectal: Nontender, no blood, heme positive stool Musculoskeletal: Nontender with normal range of motion in all extremities. No lower extremity tenderness nor edema. Neurologic:  Normal speech and language. No gross focal neurologic deficits are appreciated.  Skin:  Skin is warm, dry and intact. No rash noted. Psychiatric: Mood and affect are normal. Speech and behavior are normal.  ____________________________________________  EKG: Interpreted by me. Sinus rhythm rate of 61 bpm, normal PR interval, normal QRS, normal QT.  ____________________________________________  ED COURSE:  Pertinent labs & imaging results that were available during my care of the patient were reviewed by me and considered in my medical decision making (see chart for details). Patient presents the ER for possible anemia and weakness. We will assess with labs and imaging.   Procedures ____________________________________________   LABS (pertinent positives/negatives)  Labs  Reviewed  CBC WITH DIFFERENTIAL/PLATELET - Abnormal; Notable for the following:       Result Value   RBC 3.67 (*)    Hemoglobin 9.2 (*)    HCT 28.4 (*)    MCV 77.4 (*)    MCH 25.2 (*)    RDW 17.3 (*)    All other components within normal limits  COMPREHENSIVE METABOLIC PANEL - Abnormal; Notable for the  following:    Glucose, Bld 102 (*)    Creatinine, Ser 1.09 (*)    Calcium 8.7 (*)    Albumin 3.4 (*)    ALT 12 (*)    GFR calc non Af Amer 44 (*)    GFR calc Af Amer 51 (*)    All other components within normal limits  TROPONIN I    RADIOLOGY Images were viewed by me  Chest x-ray IMPRESSION: Cardiomegaly without decompensation. ____________________________________________  FINAL ASSESSMENT AND PLAN  Weakness, anemia, heme positive stool  Plan: Patient with labs and imaging as dictated above. Patient presents to the ER for anemia and weakness. Symptoms are evidently from occult GI bleeding as she was heme positive. Patient does not feel well up to go home, I will discuss with the hospitalist for admission.   Emily FilbertWilliams, Ilena Dieckman E, MD   Note: This note was generated in part or whole with voice recognition software. Voice recognition is usually quite accurate but there are transcription errors that can and very often do occur. I apologize for any typographical errors that were not detected and corrected.     Emily FilbertJonathan E Markeem Noreen, MD 12/27/16 1336

## 2016-12-27 NOTE — Consult Note (Signed)
Patient with heme positive stool and anemia with a normal BUN is likely to be lower GI bleed or a very slow UGI bleed. Plan for EGD and colonoscopy for Friday.  Will see patient in consultation tomorrow.

## 2016-12-27 NOTE — H&P (Addendum)
[employer] [facility] [place] [facility]   PATIENT NAME: Brittney Trujillo    MR#:  [id number]  DATE OF BIRTH:  [birth year]  DATE OF ADMISSION:  12/27/2016  PRIMARY CARE PHYSICIAN: Alan Mulder, MD   REQUESTING/REFERRING PHYSICIAN: Dr. Daryel November  CHIEF COMPLAINT:   Chief Complaint  Patient presents with  . Fatigue  . Abnormal Lab    HISTORY OF PRESENT ILLNESS:  Brittney Trujillo  is a 83 y.o. female with a known history of COPD, GERD, chronic anemia, atrial fibrillation who presents to the hospital due to weakness and fatigue and noted to have heme positive stools. Patient presented to her primary care physician office for a routine follow-up and was complaining to him about weakness and fatigue ongoing for the past few months. On routine blood work patient's hemoglobin was noted to be on 8.4. She was noted to have heme positive stools and therefore sent to the ER for further evaluation. In the emergency room patient's hemoglobin was noted to be 9.2. Patient continues to complain of significant fatigue and weakness and therefore being admitted for suspected GI bleed. Patient denies any chest pain admits to exertional shortness of breath, no nausea, vomiting, diaphoresis, palpitations. She denies any significant weight loss or any other associated symptoms presently.  PAST MEDICAL HISTORY:   Past Medical History:  Diagnosis Date  . Anemia   . Anginal pain (HCC)   . COPD (chronic obstructive pulmonary disease) (HCC)   . GERD (gastroesophageal reflux disease)     PAST SURGICAL HISTORY:  No past surgical history on file.  SOCIAL HISTORY:   Social History  Substance Use Topics  . Smoking status: Never Smoker  . Smokeless tobacco: Not on file  . Alcohol use No    FAMILY HISTORY:   Family History  Problem Relation Age of Onset  . Dementia Mother   . Stroke Mother   . Stroke Father     DRUG ALLERGIES:   Allergies  Allergen Reactions  . Albuterol Other  (See Comments)    Patient states this medication makes her have a jerking movements and very shaky.  . Codeine Nausea And Vomiting    REVIEW OF SYSTEMS:   Review of Systems  Constitutional: Negative for fever and weight loss.  HENT: Negative for congestion, nosebleeds and tinnitus.   Eyes: Negative for blurred vision, double vision and redness.  Respiratory: Negative for cough, hemoptysis and shortness of breath.   Cardiovascular: Negative for chest pain, orthopnea, leg swelling and PND.  Gastrointestinal: Positive for blood in stool. Negative for abdominal pain, diarrhea, melena, nausea and vomiting.  Genitourinary: Negative for dysuria, hematuria and urgency.  Musculoskeletal: Negative for falls and joint pain.  Neurological: Positive for weakness. Negative for dizziness, tingling, sensory change, focal weakness, seizures and headaches.  Endo/Heme/Allergies: Negative for polydipsia. Does not bruise/bleed easily.  Psychiatric/Behavioral: Negative for depression and memory loss. The patient is not nervous/anxious.     MEDICATIONS AT HOME:   Prior to Admission medications   Medication Sig Start Date End Date Taking? Authorizing Provider  acetaminophen (TYLENOL) 500 MG tablet Take 500 mg by mouth every 6 (six) hours as needed.   Yes Historical Provider, MD  apixaban (ELIQUIS) 5 MG TABS tablet Take 5 mg by mouth 2 (two) times daily.   Yes Historical Provider, MD  denosumab (PROLIA) 60 MG/ML SOLN injection Inject 60 mg into the skin every 6 (six) months. 11/05/15  Yes Historical Provider, MD  desloratadine (CLARINEX) 5 MG tablet Take 5  mg by mouth daily.   Yes Historical Provider, MD  diltiazem (DILACOR XR) 120 MG 24 hr capsule Take 120 mg by mouth daily.   Yes Historical Provider, MD  estrogens, conjugated, (PREMARIN) 0.625 MG tablet Take 0.625 mg by mouth daily.    Yes Historical Provider, MD  FLUoxetine (PROZAC) 10 MG capsule Take 10 mg by mouth every morning.   Yes Historical Provider,  MD  NEXIUM 40 MG capsule Take 40 mg by mouth 2 (two) times daily. 09/19/16  Yes Historical Provider, MD  nitroGLYCERIN (NITROSTAT) 0.4 MG SL tablet Place 0.4 mg under the tongue every 5 (five) minutes as needed for chest pain.   Yes Historical Provider, MD  simvastatin (ZOCOR) 40 MG tablet Take 40 mg by mouth at bedtime.   Yes Historical Provider, MD  Calcium Carbonate-Vitamin D 600-400 MG-UNIT tablet Take 1 tablet by mouth 2 (two) times daily.    Historical Provider, MD  omeprazole (PRILOSEC) 20 MG capsule Take 20 mg by mouth daily.    Historical Provider, MD      VITAL SIGNS:  Blood pressure 107/60, pulse 65, temperature 97.6 F (36.4 C), temperature source Oral, resp. rate (!) 21, height 5\' 5"  (1.651 m), weight 72.6 kg (160 lb), SpO2 98 %.  PHYSICAL EXAMINATION:  Physical Exam  GENERAL:  83 y.o.-year-old patient lying in bed in no acute distress.  EYES: Pupils equal, round, reactive to light and accommodation. No scleral icterus. Extraocular muscles intact.  HEENT: Head atraumatic, normocephalic. Oropharynx and nasopharynx clear. No oropharyngeal erythema, moist oral mucosa  NECK:  Supple, no jugular venous distention. No thyroid enlargement, no tenderness.  LUNGS: Normal breath sounds bilaterally, no wheezing, rales, rhonchi. No use of accessory muscles of respiration.  CARDIOVASCULAR: S1, S2 RRR. No murmurs, rubs, gallops, clicks.  ABDOMEN: Soft, nontender, nondistended. Bowel sounds present. No organomegaly or mass.  EXTREMITIES: No pedal edema, cyanosis, or clubbing. + 2 pedal & radial pulses b/l.   NEUROLOGIC: Cranial nerves II through XII are intact. No focal Motor or sensory deficits appreciated b/l PSYCHIATRIC: The patient is alert and oriented x 3. Good affect.  SKIN: No obvious rash, lesion, or ulcer.   LABORATORY PANEL:   CBC  Recent Labs Lab 12/27/16 1147  WBC 9.9  HGB 9.2*  HCT 28.4*  PLT 321    ------------------------------------------------------------------------------------------------------------------  Chemistries   Recent Labs Lab 12/27/16 1147  NA 137  K 4.3  CL 106  CO2 23  GLUCOSE 102*  BUN 12  CREATININE 1.09*  CALCIUM 8.7*  AST 25  ALT 12*  ALKPHOS 46  BILITOT 0.9   ------------------------------------------------------------------------------------------------------------------  Cardiac Enzymes  Recent Labs Lab 12/27/16 1147  TROPONINI <0.03   ------------------------------------------------------------------------------------------------------------------  RADIOLOGY:  Dg Chest 2 View  Result Date: 12/27/2016 CLINICAL DATA:  Weakness.  Low hemoglobin.  Low iron. EXAM: CHEST  2 VIEW COMPARISON:  02/06/2016 FINDINGS: Borderline cardiomegaly. Clear lungs. No pneumothorax or pleural effusion. Postoperative changes at the gastroesophageal junction. IMPRESSION: Cardiomegaly without decompensation. Electronically Signed   By: Jolaine ClickArthur  Hoss M.D.   On: 12/27/2016 12:21     IMPRESSION AND PLAN:   83 year old female with past medical history of COPD, GERD, atrial fibrillation who presents to the hospital due to weakness and fatigue and noted to have heme positive stools.  1. GI bleed-this is a suspected diagnosis given patient's heme positive stools and anemia. -This is likely a slow upper GI bleed. Patient denies any overt hematochezia or melena. Hemoglobin is currently stable. -We'll place on clear  liquid diet, PPI twice a day, get a gastroenterology consult. Hold Eliquis.   2. Symptomatic Anemia - pt. Complaining of fatigue, weakness.  - Hg. Down to 9.2 today. Previously in 2015 was 12.  - follow Hg. No urgent need for transfusion. Will transfuse if < 7.   3. Hx of A. Fib - rate controlled.  - cont. Cardizem, Hold eliquis.   4. GERD - cont. PPI BID  5. Depression - cont. Prozac.   6. Hyperlipidemia - cont. Zocor.     All the records are  reviewed and case discussed with ED provider. Management plans discussed with the patient, family and they are in agreement.  CODE STATUS: Full code  TOTAL TIME TAKING CARE OF THIS PATIENT: 45 minutes.    Houston Siren M.D on 12/27/2016 at 2:25 PM  Between 7am to 6pm - Pager - [phone]  After 6pm go to [email or web address] - password [email or web address] [facility]  [facility]  Office  [phone]  CC: Primary care physician; Alan Mulder, MD

## 2016-12-28 ENCOUNTER — Encounter: Payer: Self-pay | Admitting: Nurse Practitioner

## 2016-12-28 DIAGNOSIS — Z9049 Acquired absence of other specified parts of digestive tract: Secondary | ICD-10-CM | POA: Diagnosis not present

## 2016-12-28 DIAGNOSIS — J449 Chronic obstructive pulmonary disease, unspecified: Secondary | ICD-10-CM | POA: Diagnosis present

## 2016-12-28 DIAGNOSIS — K922 Gastrointestinal hemorrhage, unspecified: Secondary | ICD-10-CM | POA: Diagnosis present

## 2016-12-28 DIAGNOSIS — D509 Iron deficiency anemia, unspecified: Secondary | ICD-10-CM | POA: Diagnosis present

## 2016-12-28 DIAGNOSIS — Z8673 Personal history of transient ischemic attack (TIA), and cerebral infarction without residual deficits: Secondary | ICD-10-CM | POA: Diagnosis not present

## 2016-12-28 DIAGNOSIS — F329 Major depressive disorder, single episode, unspecified: Secondary | ICD-10-CM | POA: Diagnosis present

## 2016-12-28 DIAGNOSIS — Z888 Allergy status to other drugs, medicaments and biological substances status: Secondary | ICD-10-CM | POA: Diagnosis not present

## 2016-12-28 DIAGNOSIS — I25119 Atherosclerotic heart disease of native coronary artery with unspecified angina pectoris: Secondary | ICD-10-CM | POA: Diagnosis present

## 2016-12-28 DIAGNOSIS — K5730 Diverticulosis of large intestine without perforation or abscess without bleeding: Secondary | ICD-10-CM | POA: Diagnosis present

## 2016-12-28 DIAGNOSIS — R531 Weakness: Secondary | ICD-10-CM | POA: Diagnosis present

## 2016-12-28 DIAGNOSIS — Z79899 Other long term (current) drug therapy: Secondary | ICD-10-CM | POA: Diagnosis not present

## 2016-12-28 DIAGNOSIS — Z823 Family history of stroke: Secondary | ICD-10-CM | POA: Diagnosis not present

## 2016-12-28 DIAGNOSIS — Z90710 Acquired absence of both cervix and uterus: Secondary | ICD-10-CM | POA: Diagnosis not present

## 2016-12-28 DIAGNOSIS — E785 Hyperlipidemia, unspecified: Secondary | ICD-10-CM | POA: Diagnosis present

## 2016-12-28 DIAGNOSIS — Z7901 Long term (current) use of anticoagulants: Secondary | ICD-10-CM | POA: Diagnosis not present

## 2016-12-28 DIAGNOSIS — E119 Type 2 diabetes mellitus without complications: Secondary | ICD-10-CM | POA: Diagnosis present

## 2016-12-28 DIAGNOSIS — I482 Chronic atrial fibrillation: Secondary | ICD-10-CM | POA: Diagnosis present

## 2016-12-28 DIAGNOSIS — K219 Gastro-esophageal reflux disease without esophagitis: Secondary | ICD-10-CM | POA: Diagnosis present

## 2016-12-28 DIAGNOSIS — Z885 Allergy status to narcotic agent status: Secondary | ICD-10-CM | POA: Diagnosis not present

## 2016-12-28 LAB — CBC
HCT: 29 % — ABNORMAL LOW (ref 35.0–47.0)
Hemoglobin: 9 g/dL — ABNORMAL LOW (ref 12.0–16.0)
MCH: 24.5 pg — AB (ref 26.0–34.0)
MCHC: 31.1 g/dL — AB (ref 32.0–36.0)
MCV: 78.7 fL — AB (ref 80.0–100.0)
PLATELETS: 294 10*3/uL (ref 150–440)
RBC: 3.68 MIL/uL — ABNORMAL LOW (ref 3.80–5.20)
RDW: 17.3 % — AB (ref 11.5–14.5)
WBC: 7 10*3/uL (ref 3.6–11.0)

## 2016-12-28 LAB — BASIC METABOLIC PANEL
Anion gap: 6 (ref 5–15)
BUN: 10 mg/dL (ref 6–20)
CHLORIDE: 106 mmol/L (ref 101–111)
CO2: 26 mmol/L (ref 22–32)
CREATININE: 0.95 mg/dL (ref 0.44–1.00)
Calcium: 8.7 mg/dL — ABNORMAL LOW (ref 8.9–10.3)
GFR calc Af Amer: >60 mL/min (ref >60)
GFR calc non Af Amer: 52 mL/min — ABNORMAL LOW (ref >60)
GLUCOSE: 86 mg/dL (ref 65–99)
Potassium: 3.9 mmol/L (ref 3.5–5.1)
Sodium: 138 mmol/L (ref 135–145)

## 2016-12-28 MED ORDER — ATORVASTATIN CALCIUM 20 MG PO TABS
20.0000 mg | ORAL_TABLET | Freq: Every day | ORAL | Status: DC
  Administered 2016-12-28: 17:00:00 20 mg via ORAL
  Filled 2016-12-28: qty 1

## 2016-12-28 MED ORDER — ALPRAZOLAM 0.5 MG PO TABS
0.5000 mg | ORAL_TABLET | Freq: Once | ORAL | Status: AC
  Administered 2016-12-28: 0.5 mg via ORAL
  Filled 2016-12-28: qty 1

## 2016-12-28 NOTE — Consult Note (Signed)
After evaluation of patient she is at great risk for any endoscopic procedure and we will not be doing any procedures on her at this time.  [facility] that she could not have any procedures due to her high risk.   Will plan to get CT scan of abdomen tomorrow and start full liquids tonight.  May restart Eliquis or not or keep on ASA.  She may need episodic transfusions if hgb gets too low.

## 2016-12-28 NOTE — Progress Notes (Signed)
Chaplain provided education and helped patient and her husband to complete two Advance Directives.

## 2016-12-28 NOTE — Consult Note (Signed)
Consultation  Referring Provider: Dr. Cherlynn Kaiser Primary Care Physician:  Alan Mulder, MD Consulting  Gastroenterologist:   Dr. Lynnae Prude      Reason for Consultation:  Heme positive stool and anemia         HPI:   Brittney Trujillo is a 83 y.o. female with a known history of COPD, GERD, HH, chronic anemia, atrial fibrillation, angina, DM2, HL, history of TIA, SVT who presents to the hospital due to weakness and fatigue and noted to have heme positive stools.  Patient reports history of chronic iron deficiency anemia as long as she can remember.  She was placed on oral iron in October 2017 but stopped it after 2 weeks secondary to constipation.  She is worried about taking chronic laxatives so she stopped the iron.  She went for regular physical exam yesterday and her hemoglobin was found to be 8.4  ( Hgb 11.2 on 02/06/16) and she was sent to the hospital.  She says she has been so weak she can hardly go.  She has noted no black or red stool and she looks regularly.  No upper or lower GI complaints. Hgb staying 9.0 range. She is on clear liquid diet and is hungry.   She does have a history of colonoscopy in the 1980s.  She reports a history of EGD 2 or 3 times may be the most recently 20 years ago. No hx of GI bleed or PUD.  She  had laparoscopic hiatal hernia repair in 1995 and subsequent GERD symptoms and was told the [facility] is back. She does take Nexium twice daily which controls her reflux well. She does not take Protonix.  She has frequent angina, takes nitroglycerin 1-3 times per week.  She says her heart doctors told her she is on the verge of having a heart attack or stroke and there is nothing that can be done about it.  She has been placed on Eliquis for a few years.  She avoids NSAIDs.  She said that she was told that she could not be put to sleep again because her heart stops.    Past surgical history lap chole 1992 hysterectomy complete 1991 laparoscopic antireflux surgery  1995. Patient presented to her primary care physician office for a routine follow-up and was complaining to him about weakness and fatigue ongoing for the past few months.  Past Medical History:  Diagnosis Date  . Anemia   . Anginal pain (HCC)   . COPD (chronic obstructive pulmonary disease) (HCC)   . GERD (gastroesophageal reflux disease)     Past Surgical History:  Procedure Laterality Date  . anti reflux surgery  1995  . CHOLECYSTECTOMY  1992   Lap chole  . hysterectomy  1991    Family History  Problem Relation Age of Onset  . Dementia Mother   . Stroke Mother   . Stroke Father      Social History  Substance Use Topics  . Smoking status: Never Smoker  . Smokeless tobacco: Never Used  . Alcohol use No    Prior to Admission medications   Medication Sig Start Date End Date Taking? Authorizing Provider  acetaminophen (TYLENOL) 500 MG tablet Take 500 mg by mouth every 6 (six) hours as needed.   Yes Historical Provider, MD  apixaban (ELIQUIS) 5 MG TABS tablet Take 5 mg by mouth 2 (two) times daily.   Yes Historical Provider, MD  denosumab (PROLIA) 60 MG/ML SOLN injection Inject 60 mg into the skin every  6 (six) months. 11/05/15  Yes Historical Provider, MD  desloratadine (CLARINEX) 5 MG tablet Take 5 mg by mouth daily.   Yes Historical Provider, MD  diltiazem (DILACOR XR) 120 MG 24 hr capsule Take 120 mg by mouth daily.   Yes Historical Provider, MD  estrogens, conjugated, (PREMARIN) 0.625 MG tablet Take 0.625 mg by mouth daily.    Yes Historical Provider, MD  FLUoxetine (PROZAC) 10 MG capsule Take 10 mg by mouth every morning.   Yes Historical Provider, MD  NEXIUM 40 MG capsule Take 40 mg by mouth 2 (two) times daily. 09/19/16  Yes Historical Provider, MD  nitroGLYCERIN (NITROSTAT) 0.4 MG SL tablet Place 0.4 mg under the tongue every 5 (five) minutes as needed for chest pain.   Yes Historical Provider, MD  simvastatin (ZOCOR) 40 MG tablet Take 40 mg by mouth at bedtime.   Yes  Historical Provider, MD  Calcium Carbonate-Vitamin D 600-400 MG-UNIT tablet Take 1 tablet by mouth 2 (two) times daily.    Historical Provider, MD  omeprazole (PRILOSEC) 20 MG capsule Take 20 mg by mouth daily.    Historical Provider, MD    Current Facility-Administered Medications  Medication Dose Route Frequency Provider Last Rate Last Dose  . acetaminophen (TYLENOL) tablet 650 mg  650 mg Oral Q6H PRN Houston SirenVivek J Sainani, MD   650 mg at 12/27/16 1659   Or  . acetaminophen (TYLENOL) suppository 650 mg  650 mg Rectal Q6H PRN Houston SirenVivek J Sainani, MD      . atorvastatin (LIPITOR) tablet 20 mg  20 mg Oral q1800 Houston SirenVivek J Sainani, MD      . calcium-vitamin D (OSCAL WITH D) 500-200 MG-UNIT per tablet 1 tablet  1 tablet Oral BID Houston SirenVivek J Sainani, MD   1 tablet at 12/27/16 2113  . diltiazem (CARDIZEM CD) 24 hr capsule 120 mg  120 mg Oral Daily Houston SirenVivek J Sainani, MD   120 mg at 12/27/16 1659  . estrogens (conjugated) (PREMARIN) tablet 0.625 mg  0.625 mg Oral Daily Houston SirenVivek J Sainani, MD   0.625 mg at 12/27/16 1659  . FLUoxetine (PROZAC) capsule 10 mg  10 mg Oral Daily Houston SirenVivek J Sainani, MD   10 mg at 12/27/16 1659  . nitroGLYCERIN (NITROSTAT) SL tablet 0.4 mg  0.4 mg Sublingual Q5 min PRN Houston SirenVivek J Sainani, MD      . ondansetron [facility](ZOFRAN) tablet 4 mg  4 mg Oral Q6H PRN Houston SirenVivek J Sainani, MD       Or  . ondansetron (ZOFRAN) injection 4 mg  4 mg Intravenous Q6H PRN Houston SirenVivek J Sainani, MD        Allergies as of 12/27/2016 - Review Complete 12/27/2016  Allergen Reaction Noted  . Albuterol Other (See Comments) 11/03/2015  . Codeine Nausea And Vomiting 11/03/2015     Review of Systems:    A 12 system review was obtained and all negative except where noted in HPI. Positive COPD and angina.     Physical Exam:  Vital signs in last 24 hours: Temp:  [97.7 F (36.5 C)-98.7 F (37.1 C)] 98.7 F (37.1 C) (03/22 1230) Pulse Rate:  [62-71] 65 (03/22 1230) Resp:  [17-19] 18 (03/22 1230) BP: (103-133)/(45-91) 119/59 (03/22  1230) SpO2:  [95 %-100 %] 100 % (03/22 1230) Weight:  [72.6 kg (160 lb 2 oz)] 72.6 kg (160 lb 2 oz) (03/21 1628) Last BM Date: 12/27/16  General:  Well-developed, well-nourished and in no acute distress Head:  Head without obvious abnormality, atraumatic  Eyes:   Conjunctiva pink, sclera anicteric   ENT:   Mouth free of lesions, mucosa moist, tongue pink, no thrush noted, teeth and gums normal Neck:   Supple w/o thyromegaly or mass, trachea midline, no adenopathy  Lungs: Clear to auscultation bilaterally, respirations unlabored Heart:     Normal S1S2, no rubs, murmurs, gallops. Abdomen: Soft, non-tender, no hepatosplenomegaly, hernia, or mass and BS normal Rectal: Deferred Lymph:  No cervical or supraclavicular adenopathy. Extremities:   No edema, cyanosis, or clubbing Skin  Skin color, texture, turgor normal, no rashes or lesions Neuro:  A&O x 3. CNII-XII intact, normal strength Psych:  Appropriate mood and affect.  Data Reviewed:  LAB RESULTS:  Recent Labs  12/27/16 1147 12/28/16 0703  WBC 9.9 7.0  HGB 9.2* 9.0*  HCT 28.4* 29.0*  PLT 321 294   BMET  Recent Labs  12/27/16 1147 12/28/16 0703  NA 137 138  K 4.3 3.9  CL 106 106  CO2 23 26  GLUCOSE 102* 86  BUN 12 10  CREATININE 1.09* 0.95  CALCIUM 8.7* 8.7*   LFT  Recent Labs  12/27/16 1147  PROT 7.1  ALBUMIN 3.4*  AST 25  ALT 12*  ALKPHOS 46  BILITOT 0.9   PT/INR No results for input(s): LABPROT, INR in the last 72 hours.  STUDIES: Dg Chest 2 View  Result Date: 12/27/2016 CLINICAL DATA:  Weakness.  Low hemoglobin.  Low iron. EXAM: CHEST  2 VIEW COMPARISON:  02/06/2016 FINDINGS: Borderline cardiomegaly. Clear lungs. No pneumothorax or pleural effusion. Postoperative changes at the gastroesophageal junction. IMPRESSION: Cardiomegaly without decompensation. Electronically Signed   By: Jolaine Click M.D.   On: 12/27/2016 12:21     Assessment:  Meliana Canner is a 83 y.o. with a known history of COPD,  GERD, HH, chronic anemia, atrial fibrillation, angina, DM2, HL, history of TIA, SVT who presents to the hospital due to weakness and fatigue and noted to have heme positive stools and acute on chronic anemia.    Plan:  She is 83 yo with frequent angina at baseline and is not a candidate for luminal evaluation. She is not having any chest pain or angina equivalent pain now. She was on Eliquis. Her progressive fatigue and weakness likely secondary to acute on chronic anemia. She was heme positive with normal BUN and no reported melena on admission. No GI complaints with GERD well controlled. Hx of HH noted. Remote colonoscopy that she reports was normal. No bowel habit changes.  No weight loss. She is willing to have an UGI study. She is willing to take oral iron with laxatives if needed. She did not meet hospital requirement for blood transfusion.    Recommend cardiology consult with patient with known CAD and request optimization of management and she reports frequent angina.Inquire about stopping Eliquis for a while. Add back her PPI bid-done    This case was  discussed with Dr. Scot Jun in collaboration of care. Thank you for the consultation.  These services provided by Amedeo Kinsman RN, MSN, ANP-BC under collaborative practice agreement with Scot Jun, MD.  12/28/2016, 3:00 PM

## 2016-12-28 NOTE — Progress Notes (Signed)
[facility] [place] at [facility]   PATIENT NAME: Brittney Trujillo    MR#:  [id number]  DATE OF BIRTH:  1926/12/23  SUBJECTIVE: Admitted for fatigue, guaiac-positive stools, anemia. Patient's hemoglobin around 9 since admission. Did not have any black stools. Complains of stomach cramps and hunger pains. Having generalized weakness for a few months. Hemoglobin  An  year ago was 12.   CHIEF COMPLAINT:   Chief Complaint  Patient presents with  . Fatigue  . Abnormal Lab    REVIEW OF SYSTEMS:   ROS CONSTITUTIONAL:  Has , fatigue or weakness.  EYES: No blurred or double vision.  EARS, NOSE, AND THROAT: No tinnitus or ear pain.  RESPIRATORY: No cough, shortness of breath, wheezing or hemoptysis.  CARDIOVASCULAR: No chest pain, orthopnea, edema.  GASTROINTESTINAL: No nausea, vomiting, diarrhea or abdominal pain.  GENITOURINARY: No dysuria, hematuria.  ENDOCRINE: No polyuria, nocturia,  HEMATOLOGY: No anemia, easy bruising or bleeding SKIN: No rash or lesion. MUSCULOSKELETAL: No joint pain or arthritis.   NEUROLOGIC: No tingling, numbness, weakness.  PSYCHIATRY: No anxiety or depression.   DRUG ALLERGIES:   Allergies  Allergen Reactions  . Albuterol Other (See Comments)    Patient states this medication makes her have a jerking movements and very shaky.  . Codeine Nausea And Vomiting    VITALS:  Blood pressure (!) 119/59, pulse 65, temperature 98.7 F (37.1 C), resp. rate 18, height 5\' 5"  (1.651 m), weight 72.6 kg (160 lb 2 oz), SpO2 100 %.  PHYSICAL EXAMINATION:  GENERAL:  83 y.o.-year-old patient lying in the bed with no acute distress.  EYES: Pupils equal, round, reactive to light and accommodation. No scleral icterus. Extraocular muscles intact.  HEENT: Head atraumatic, normocephalic. Oropharynx and nasopharynx clear.  NECK:  Supple, no jugular venous distention. No thyroid enlargement, no tenderness.  LUNGS: Normal breath sounds bilaterally, no  wheezing, rales,rhonchi or crepitation. No use of accessory muscles of respiration.  CARDIOVASCULAR: S1, S2 normal. No murmurs, rubs, or gallops.  ABDOMEN: Soft, nontender, nondistended. Bowel sounds present. No organomegaly or mass.  EXTREMITIES: No pedal edema, cyanosis, or clubbing.  NEUROLOGIC: Cranial nerves II through XII are intact. Muscle strength 5/5 in all extremities. Sensation intact. Gait not checked.  PSYCHIATRIC: The patient is alert and oriented x 3.  SKIN: No obvious rash, lesion, or ulcer.    LABORATORY PANEL:   CBC  Recent Labs Lab 12/28/16 0703  WBC 7.0  HGB 9.0*  HCT 29.0*  PLT 294   ------------------------------------------------------------------------------------------------------------------  Chemistries   Recent Labs Lab 12/27/16 1147 12/28/16 0703  NA 137 138  K 4.3 3.9  CL 106 106  CO2 23 26  GLUCOSE 102* 86  BUN 12 10  CREATININE 1.09* 0.95  CALCIUM 8.7* 8.7*  AST 25  --   ALT 12*  --   ALKPHOS 46  --   BILITOT 0.9  --    ------------------------------------------------------------------------------------------------------------------  Cardiac Enzymes  Recent Labs Lab 12/27/16 1147  TROPONINI <0.03   ------------------------------------------------------------------------------------------------------------------  RADIOLOGY:  Dg Chest 2 View  Result Date: 12/27/2016 CLINICAL DATA:  Weakness.  Low hemoglobin.  Low iron. EXAM: CHEST  2 VIEW COMPARISON:  02/06/2016 FINDINGS: Borderline cardiomegaly. Clear lungs. No pneumothorax or pleural effusion. Postoperative changes at the gastroesophageal junction. IMPRESSION: Cardiomegaly without decompensation. Electronically Signed   By: Jolaine Click M.D.   On: 12/27/2016 12:21    EKG:   Orders placed or performed during the hospital encounter of 12/27/16  . ED EKG  .  ED EKG  . EKG 12-Lead  . EKG 12-Lead    ASSESSMENT AND PLAN:   Generalized weakness, fatigue, symptomatic anemia:    #2 GI bleed suspected with heme-positive stools: Initially patient requested solid diet and wanted to go home but after discussing the fact that she needs to be on blood thinners for her atrial fibrillation she decided to stay and have a colonoscopy tomorrow,Continue to hold apixiban. consult to GI. Continue liquid diet, PPIs 2. Chronic atrial fibrillation: Continue Cardizem, hold the blood thinners. #3/depression: Continue Prozac #4 hyperlipidemia: Continue statins    All the records are reviewed and case discussed with Care Management/Social Workerr. Management plans discussed with the patient, family and they are in agreement.  CODE STATUS:full  TOTAL TIME TAKING CARE OF THIS PATIENT:35 minutes.   POSSIBLE D/C IN 1-2DAYS, DEPENDING ON CLINICAL CONDITION.   Katha HammingKONIDENA,Dream Harman M.D on 12/28/2016 at 2:19 PM  Between 7am to 6pm - Pager - [phone]  After 6pm go to [email or web address] - password [email or web address]  [facility] [place] [facility]  Office  [phone]  CC: Primary care physician; Alan MulderMORAYATI,SHAMIL J, MD   Note: This dictation was prepared with Dragon dictation along with smaller phrase technology. Any transcriptional errors that result from this process are unintentional.

## 2016-12-29 ENCOUNTER — Inpatient Hospital Stay: Payer: Medicare Other

## 2016-12-29 MED ORDER — APIXABAN 5 MG PO TABS: 5.0000 mg | ORAL_TABLET | Freq: Two times a day (BID) | ORAL | Status: DC

## 2016-12-29 MED ORDER — IOPAMIDOL (ISOVUE-300) INJECTION 61%
15.0000 mL | INTRAVENOUS | Status: AC
  Administered 2016-12-29 (×2): 15 mL via ORAL

## 2016-12-29 NOTE — Discharge Summary (Signed)
Brittney Trujillo, is a 83 y.o. female  DOB 1927-08-16  MRN [id number].  Admission date:  12/27/2016  Admitting Physician  Houston Siren, MD  Discharge Date:  12/29/2016   Primary MD  Alan Mulder, MD  Recommendations for primary care physician for things to follow:  Follow-up with PMD in one week    Admission Diagnosis  Weakness [R53.1] Heme positive stool [R19.5] Anemia, unspecified type [D64.9]   Discharge Diagnosis  Weakness [R53.1] Heme positive stool [R19.5] Anemia, unspecified type [D64.9]    Active Problems:   GI bleed      Past Medical History:  Diagnosis Date  . Anemia   . Anginal pain (HCC)   . COPD (chronic obstructive pulmonary disease) (HCC)   . GERD (gastroesophageal reflux disease)     Past Surgical History:  Procedure Laterality Date  . anti reflux surgery  1995  . CHOLECYSTECTOMY  1992   Lap chole  . hysterectomy  1991       History of present illness and  Hospital Course:     Kindly see H&P for history of present illness and admission details, please review complete Labs, Consult reports and Test reports for all details in brief  HPI  from the history and physical done on the day of admission 83 year old female patient admitted because of fatigue, generalized weakne found to have hemoglobin 8.4 with PCP. Patient also had guaiac-positive stools.   Hospital Course  1.suspected GI bleed with heme-positive stools, anemia: Never required transfusion, hemoglobin stayed stable around 9. Patient seen by gastroenterology, CT of abdomen done didn't show any evidence of structural problems but showed advance sigmoid diverticulosis. Patient did not have any malignant evaluation because of high risk due to her advanced age, being on blood thinners.discharge  Her home today with eliquis ane  he can follow with her PCP in one week.  Chronic atrial fibrillation: Continue Cardizem,Eliquis #3/depression: Continue Prozac #4 hyperlipidemia: Continue statins  Discharge Condition: stable   Follow UP  Follow-up Information    MORAYATI,SHAMIL J, MD Follow up in 1 week(s).   Specialty:  Endocrinology Contact information: [address] [place] [address] [phone]             Discharge Instructions  and  Discharge Medications      Allergies as of 12/29/2016      Reactions   Albuterol Other (See Comments)   Patient states this medication makes her have a jerking movements and very shaky.   Codeine Nausea And Vomiting      Medication List    TAKE these medications   acetaminophen 500 MG tablet Commonly known as:  TYLENOL Take 500 mg by mouth every 6 (six) hours as needed.   Calcium Carbonate-Vitamin D 600-400 MG-UNIT tablet Take 1 tablet by mouth 2 (two) times daily.   desloratadine 5 MG tablet Commonly known as:  CLARINEX Take 5 mg by mouth daily.   diltiazem 120 MG 24 hr capsule Commonly known as:  DILACOR XR Take 120 mg by mouth daily.   ELIQUIS 5 MG Tabs tablet Generic drug:  apixaban Take 5 mg by mouth 2 (two) times daily.   estrogens (conjugated) 0.625 MG tablet Commonly known as:  PREMARIN Take 0.625 mg by mouth daily.   FLUoxetine 10 MG capsule Commonly known as:  PROZAC Take 10 mg by mouth every morning.   NEXIUM 40 MG capsule Generic drug:  esomeprazole Take 40 mg by mouth 2 (two) times daily.   nitroGLYCERIN 0.4  MG SL tablet Commonly known as:  NITROSTAT Place 0.4 mg under the tongue every 5 (five) minutes as needed for chest pain.   omeprazole 20 MG capsule Commonly known as:  PRILOSEC Take 20 mg by mouth daily.   PROLIA 60 MG/ML Soln injection Generic drug:  denosumab Inject 60 mg into the skin every 6 (six) months.   simvastatin 40 MG tablet Commonly known as:  ZOCOR Take 40 mg by mouth at bedtime.          Diet and Activity recommendation: See Discharge Instructions above   Consults obtained - gastroenterology   Major procedures and Radiology Reports - PLEASE review detailed and final reports for all details, in brief -      Ct Abdomen Pelvis Wo Contrast  Result Date: 12/29/2016 CLINICAL DATA:  Iron deficiency anemia. EXAM: CT ABDOMEN AND PELVIS WITHOUT CONTRAST TECHNIQUE: Multidetector CT imaging of the abdomen and pelvis was performed following the standard protocol without IV contrast. COMPARISON:  None. FINDINGS: Lower chest: The lung bases are clear of acute process. No worrisome pulmonary lesions. The heart is within normal limits in size for age. No pericardial effusion. There is a moderate to large hiatal hernia. Surgical changes noted along the GE junction. Hepatobiliary: No focal hepatic lesions or intrahepatic biliary dilatation. Gallbladder is surgically absent. No common bile duct dilatation. Pancreas: No obvious mass, inflammation or ductal dilatation. Spleen: Normal size.  No focal lesions. Adrenals/Urinary Tract: The adrenal glands are normal. Lower pole left renal calculus noted along with a lower pole right renal cyst. No worrisome renal lesions, hydronephrosis, ureteral or bladder calculi. The bladder is not well distended but no obvious mass. Stomach/Bowel: The stomach, duodenum, small bowel and colon are grossly normal. No inflammatory changes, mass lesions or obstructive findings. There is severe sigmoid diverticulosis without findings for acute diverticulitis. The terminal ileum is normal. The appendix is normal. Vascular/Lymphatic: Moderate scattered atherosclerotic calcifications involving the aorta and iliac arteries. No focal aneurysm. No mesenteric or retroperitoneal mass or adenopathy. Reproductive: Surgically absent. Other: No pelvic mass or adenopathy. No free pelvic fluid collections. No inguinal mass or adenopathy. No abdominal wall hernia or subcutaneous  lesions. Musculoskeletal: No significant bony findings. IMPRESSION: No acute abdominal/ pelvic findings, mass lesions or lymphadenopathy. Advanced sigmoid diverticulosis but no findings for acute diverticulitis. Small left renal calculus and lower pole right renal cyst. Status post cholecystectomy.  No biliary dilatation. Moderate to large hiatal hernia. Electronically Signed   By: Rudie MeyerP.  Gallerani M.D.   On: 12/29/2016 10:30   Dg Chest 2 View  Result Date: 12/27/2016 CLINICAL DATA:  Weakness.  Low hemoglobin.  Low iron. EXAM: CHEST  2 VIEW COMPARISON:  02/06/2016 FINDINGS: Borderline cardiomegaly. Clear lungs. No pneumothorax or pleural effusion. Postoperative changes at the gastroesophageal junction. IMPRESSION: Cardiomegaly without decompensation. Electronically Signed   By: Jolaine ClickArthur  Hoss M.D.   On: 12/27/2016 12:21    Micro Results    No results found for this or any previous visit (from the past 240 hour(s)).     Today   Subjective:   Brittney NestWanda Trujillo today has no headache,no chest abdominal pain,no new weakness tingling or numbness, feels much better wants to go home today.   Objective:   Blood pressure (!) 117/53, pulse 72, temperature 97.7 F (36.5 C), temperature source Oral, resp. rate 18, height 5\' 5"  (1.651 m), weight 72.6 kg (160 lb 2 oz), SpO2 97 %.   Intake/Output Summary (Last 24 hours) at 12/29/16 1318 Last data filed at 12/28/16  1800  Gross per 24 hour  Intake                0 ml  Output                0 ml  Net                0 ml    Exam Awake Alert, Oriented x 3, No new F.N deficits, Normal affect [place].AT,PERRAL Supple Neck,No JVD, No cervical lymphadenopathy appriciated.  Symmetrical Chest wall movement, Good air movement bilaterally, CTAB RRR,No Gallops,Rubs or new Murmurs, No Parasternal Heave +ve B.Sounds, Abd Soft, Non tender, No organomegaly appriciated, No rebound -guarding or rigidity. No Cyanosis, Clubbing or edema, No new Rash or bruise  Data Review    CBC w Diff: Lab Results  Component Value Date   WBC 7.0 12/28/2016   HGB 9.0 (L) 12/28/2016   HGB 12.0 10/06/2014   HCT 29.0 (L) 12/28/2016   HCT 37.1 10/06/2014   PLT 294 12/28/2016   PLT 237 10/06/2014   LYMPHOPCT 26 12/27/2016   LYMPHOPCT 27.9 04/30/2014   MONOPCT 6 12/27/2016   MONOPCT 7.8 04/30/2014   EOSPCT 2 12/27/2016   EOSPCT 1.4 04/30/2014   BASOPCT 1 12/27/2016   BASOPCT 0.6 04/30/2014    CMP: Lab Results  Component Value Date   NA 138 12/28/2016   NA 141 10/06/2014   K 3.9 12/28/2016   K 3.7 10/06/2014   CL 106 12/28/2016   CL 110 (H) 10/06/2014   CO2 26 12/28/2016   CO2 24 10/06/2014   BUN 10 12/28/2016   BUN 9 10/06/2014   CREATININE 0.95 12/28/2016   CREATININE 0.97 10/06/2014   PROT 7.1 12/27/2016   PROT 6.3 (L) 10/06/2014   ALBUMIN 3.4 (L) 12/27/2016   ALBUMIN 2.6 (L) 10/06/2014   BILITOT 0.9 12/27/2016   BILITOT 0.5 10/06/2014   ALKPHOS 46 12/27/2016   ALKPHOS 68 10/06/2014   AST 25 12/27/2016   AST 15 10/06/2014   ALT 12 (L) 12/27/2016   ALT 14 10/06/2014  .   Total Time in preparing paper work, data evaluation and todays exam - 35 minutes  Miata Culbreth M.D on 12/29/2016 at 1:18 PM    Note: This dictation was prepared with Dragon dictation along with smaller phrase technology. Any transcriptional errors that result from this process are unintentional.

## 2016-12-29 NOTE — Progress Notes (Signed)
Pt d/ced home w/husband.  Had CT of abdomen today with contrast, which showed adv sigmoid diverticulosis, but no finds for acute.  Also showed mod to large hiatal hernia.  Not experiencing any pain.  Had BM yesterday with no blood present.  Hgb is 9.0.  She is supposed to restart her elequis and f/u w/PCP in a week. Reviewed d/c instructions w/pt, along w/f/u appts.  Removed IV.  She will go home w/husband.

## 2020-06-27 ENCOUNTER — Emergency Department: Payer: Medicare Other

## 2020-06-27 ENCOUNTER — Other Ambulatory Visit: Payer: Self-pay

## 2020-06-27 ENCOUNTER — Emergency Department
Admission: EM | Admit: 2020-06-27 | Discharge: 2020-06-27 | Disposition: A | Discharge status: Home / Self Care | Payer: Medicare Other | Attending: Emergency Medicine | Admitting: Emergency Medicine

## 2020-06-27 DIAGNOSIS — Z7901 Long term (current) use of anticoagulants: Secondary | ICD-10-CM | POA: Diagnosis not present

## 2020-06-27 DIAGNOSIS — J449 Chronic obstructive pulmonary disease, unspecified: Secondary | ICD-10-CM | POA: Diagnosis not present

## 2020-06-27 DIAGNOSIS — S0990XA Unspecified injury of head, initial encounter: Secondary | ICD-10-CM

## 2020-06-27 DIAGNOSIS — I4891 Unspecified atrial fibrillation: Secondary | ICD-10-CM | POA: Insufficient documentation

## 2020-06-27 DIAGNOSIS — W19XXXA Unspecified fall, initial encounter: Secondary | ICD-10-CM

## 2020-06-27 DIAGNOSIS — Y9289 Other specified places as the place of occurrence of the external cause: Secondary | ICD-10-CM | POA: Insufficient documentation

## 2020-06-27 DIAGNOSIS — Y9389 Activity, other specified: Secondary | ICD-10-CM | POA: Insufficient documentation

## 2020-06-27 DIAGNOSIS — S066X0A Traumatic subarachnoid hemorrhage without loss of consciousness, initial encounter: Secondary | ICD-10-CM | POA: Insufficient documentation

## 2020-06-27 DIAGNOSIS — I609 Nontraumatic subarachnoid hemorrhage, unspecified: Secondary | ICD-10-CM

## 2020-06-27 LAB — CBC
HCT: 38.2 % (ref 36.0–46.0)
Hemoglobin: 13 g/dL (ref 12.0–15.0)
MCH: 32.1 pg (ref 26.0–34.0)
MCHC: 34 g/dL (ref 30.0–36.0)
MCV: 94.3 fL (ref 80.0–100.0)
Platelets: 218 10*3/uL (ref 150–400)
RBC: 4.05 MIL/uL (ref 3.87–5.11)
RDW: 13.1 % (ref 11.5–15.5)
WBC: 12.4 10*3/uL — ABNORMAL HIGH (ref 4.0–10.5)
nRBC: 0.3 % — ABNORMAL HIGH (ref 0.0–0.2)

## 2020-06-27 LAB — COMPREHENSIVE METABOLIC PANEL
ALT: 17 U/L (ref 0–44)
AST: 24 U/L (ref 15–41)
Albumin: 3.4 g/dL — ABNORMAL LOW (ref 3.5–5.0)
Alkaline Phosphatase: 47 U/L (ref 38–126)
Anion gap: 13 (ref 5–15)
BUN: 18 mg/dL (ref 8–23)
CO2: 22 mmol/L (ref 22–32)
Calcium: 9.4 mg/dL (ref 8.9–10.3)
Chloride: 102 mmol/L (ref 98–111)
Creatinine, Ser: 1.19 mg/dL — ABNORMAL HIGH (ref 0.44–1.00)
GFR calc Af Amer: 46 mL/min — ABNORMAL LOW (ref >60)
GFR calc non Af Amer: 40 mL/min — ABNORMAL LOW (ref >60)
Glucose, Bld: 149 mg/dL — ABNORMAL HIGH (ref 70–99)
Potassium: 4.2 mmol/L (ref 3.5–5.1)
Sodium: 137 mmol/L (ref 135–145)
Total Bilirubin: 0.9 mg/dL (ref 0.3–1.2)
Total Protein: 6.9 g/dL (ref 6.5–8.1)

## 2020-06-27 LAB — PROTIME-INR
INR: 1.1 (ref 0.8–1.2)
Prothrombin Time: 13.6 seconds (ref 11.4–15.2)

## 2020-06-27 NOTE — Discharge Instructions (Signed)
STOP TAKING YOUR ELIQUIS (BLOOD THINNER) IMMEDIATELY. DO NOT TAKE THIS UNTIL CLEARED BY YOUR REGULAR DOCTOR, OR AT LEAST 2 WEEKS AFTER YOUR INJURY.  YOUR CT SCAN TODAY SHOWED A SMALL AMOUNT OF BRUISING/BLEEDING ON YOUR BRAIN.   THIS SHOULD RESOLVE ON ITS OWN.  TAKE TYLENOL FOR PAIN.  RETURN TO THE ER WITH ANY WORSENING HEADACHE, CONFUSION, WEAKNESS, OR OTHER NEW SYMPTOMS

## 2020-06-27 NOTE — ED Notes (Signed)
Husband at bedside.  

## 2020-06-27 NOTE — ED Notes (Signed)
Pt requesting husband in room, husband not in lobby, outside or answering phone. Will attempt again
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
## 2020-06-29 ENCOUNTER — Other Ambulatory Visit: Payer: Self-pay | Admitting: Neurosurgery

## 2020-06-29 DIAGNOSIS — S066X9A Traumatic subarachnoid hemorrhage with loss of consciousness of unspecified duration, initial encounter: Secondary | ICD-10-CM

## 2020-06-29 DIAGNOSIS — Z8679 Personal history of other diseases of the circulatory system: Secondary | ICD-10-CM

## 2020-07-27 ENCOUNTER — Other Ambulatory Visit: Payer: Self-pay

## 2020-07-27 ENCOUNTER — Ambulatory Visit
Admission: RE | Admit: 2020-07-27 | Discharge: 2020-07-27 | Disposition: A | Discharge status: Home / Self Care | Payer: Medicare Other | Source: Ambulatory Visit | Attending: Neurosurgery | Admitting: Neurosurgery

## 2020-07-27 DIAGNOSIS — S066X9A Traumatic subarachnoid hemorrhage with loss of consciousness of unspecified duration, initial encounter: Secondary | ICD-10-CM

## 2020-07-27 DIAGNOSIS — Z8679 Personal history of other diseases of the circulatory system: Secondary | ICD-10-CM | POA: Diagnosis present

## 2021-05-23 ENCOUNTER — Emergency Department: Payer: Medicare Other

## 2021-05-23 ENCOUNTER — Other Ambulatory Visit: Payer: Self-pay

## 2021-05-23 ENCOUNTER — Emergency Department
Admission: EM | Admit: 2021-05-23 | Discharge: 2021-05-23 | Disposition: A | Discharge status: Home / Self Care | Payer: Medicare Other | Attending: Emergency Medicine | Admitting: Emergency Medicine

## 2021-05-23 DIAGNOSIS — W19XXXA Unspecified fall, initial encounter: Secondary | ICD-10-CM

## 2021-05-23 DIAGNOSIS — M25511 Pain in right shoulder: Secondary | ICD-10-CM | POA: Diagnosis present

## 2021-05-23 DIAGNOSIS — J449 Chronic obstructive pulmonary disease, unspecified: Secondary | ICD-10-CM | POA: Insufficient documentation

## 2021-05-23 DIAGNOSIS — Z7901 Long term (current) use of anticoagulants: Secondary | ICD-10-CM | POA: Diagnosis not present

## 2021-05-23 DIAGNOSIS — I4891 Unspecified atrial fibrillation: Secondary | ICD-10-CM | POA: Diagnosis not present

## 2021-05-23 DIAGNOSIS — W1839XA Other fall on same level, initial encounter: Secondary | ICD-10-CM | POA: Diagnosis not present

## 2021-05-23 DIAGNOSIS — R519 Headache, unspecified: Secondary | ICD-10-CM | POA: Diagnosis not present

## 2021-05-23 LAB — BASIC METABOLIC PANEL
Anion gap: 8 (ref 5–15)
BUN: 18 mg/dL (ref 8–23)
CO2: 26 mmol/L (ref 22–32)
Calcium: 8.9 mg/dL (ref 8.9–10.3)
Chloride: 105 mmol/L (ref 98–111)
Creatinine, Ser: 1.11 mg/dL — ABNORMAL HIGH (ref 0.44–1.00)
GFR, Estimated: 47 mL/min — ABNORMAL LOW (ref >60)
Glucose, Bld: 164 mg/dL — ABNORMAL HIGH (ref 70–99)
Potassium: 3.9 mmol/L (ref 3.5–5.1)
Sodium: 139 mmol/L (ref 135–145)

## 2021-05-23 LAB — CBC
HCT: 38.7 % (ref 36.0–46.0)
Hemoglobin: 13.1 g/dL (ref 12.0–15.0)
MCH: 32.6 pg (ref 26.0–34.0)
MCHC: 33.9 g/dL (ref 30.0–36.0)
MCV: 96.3 fL (ref 80.0–100.0)
Platelets: 222 10*3/uL (ref 150–400)
RBC: 4.02 MIL/uL (ref 3.87–5.11)
RDW: 14 % (ref 11.5–15.5)
WBC: 8.7 10*3/uL (ref 4.0–10.5)
nRBC: 0 % (ref 0.0–0.2)

## 2021-05-23 LAB — PROTIME-INR
INR: 1 (ref 0.8–1.2)
Prothrombin Time: 12.9 seconds (ref 11.4–15.2)

## 2021-05-23 MED ORDER — ACETAMINOPHEN 500 MG PO TABS
1000.0000 mg | ORAL_TABLET | Freq: Once | ORAL | Status: DC
  Filled 2021-05-23: qty 2

## 2021-05-23 NOTE — ED Notes (Signed)
Pt NAD, verbalizes understanding of all DC and f/u instructions. All questions answered. Pt wheeled in w/c to lobby with husband.
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
## 2021-05-23 NOTE — ED Triage Notes (Signed)
See previous note, pt c/o pain to back of head, some neck pain, right shoulder pain.
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
## 2024-04-15 ENCOUNTER — Other Ambulatory Visit: Payer: Self-pay

## 2024-04-15 DIAGNOSIS — R1310 Dysphagia, unspecified: Secondary | ICD-10-CM

## 2024-04-18 ENCOUNTER — Ambulatory Visit
Admission: RE | Admit: 2024-04-18 | Discharge: 2024-04-18 | Disposition: A | Discharge status: Home / Self Care | Source: Ambulatory Visit

## 2024-04-18 DIAGNOSIS — R1310 Dysphagia, unspecified: Secondary | ICD-10-CM | POA: Diagnosis present

## 2024-04-18 MED ORDER — IOHEXOL 300 MG/ML  SOLN
75.0000 mL | Freq: Once | INTRAMUSCULAR | Status: AC | PRN
  Administered 2024-04-18: 75 mL via INTRAVENOUS

## 2024-04-18 MED ORDER — SODIUM CHLORIDE 0.9 % IV SOLN: INTRAVENOUS | Status: DC

## 2024-04-24 ENCOUNTER — Encounter: Payer: Self-pay | Admitting: Unknown Physician Specialty

## 2024-04-25 ENCOUNTER — Other Ambulatory Visit: Payer: Self-pay | Admitting: Unknown Physician Specialty

## 2024-04-25 DIAGNOSIS — R599 Enlarged lymph nodes, unspecified: Secondary | ICD-10-CM

## 2024-04-25 NOTE — Progress Notes (Signed)
 Patient for US  guided Core RT cervical LN biopsy on Mon 04/28/24, I called and spoke with the patient's husband, Brittney Trujillo on the phone and gave pre-procedure instructions. Brittney Trujillo was made aware to have the patient here at 12:30p and check in at the [facility]. Phil stated understanding. Called 04/25/24

## 2024-04-28 ENCOUNTER — Ambulatory Visit
Admission: RE | Admit: 2024-04-28 | Discharge: 2024-04-28 | Disposition: A | Discharge status: Home / Self Care | Source: Ambulatory Visit | Attending: Unknown Physician Specialty | Admitting: Unknown Physician Specialty

## 2024-04-28 ENCOUNTER — Other Ambulatory Visit: Payer: Self-pay | Admitting: Unknown Physician Specialty

## 2024-04-28 DIAGNOSIS — R896 Abnormal cytological findings in specimens from other organs, systems and tissues: Secondary | ICD-10-CM | POA: Diagnosis not present

## 2024-04-28 DIAGNOSIS — R599 Enlarged lymph nodes, unspecified: Secondary | ICD-10-CM | POA: Insufficient documentation

## 2024-04-28 MED ORDER — LIDOCAINE HCL (PF) 1 % IJ SOLN
10.0000 mL | Freq: Once | INTRAMUSCULAR | Status: AC
  Administered 2024-04-28: 10 mL
  Filled 2024-04-28: qty 10

## 2024-04-28 NOTE — Procedures (Signed)
 Interventional Radiology Procedure Note  Procedure: US  ASP AND CORE BX RT NECK NECROTIC NODE    Complications: None  Estimated Blood Loss:  MIN  Findings: 18G ASP SENT FOR CYTO  18G CORES ON SALINE TELFA     EMERSON FREDERIC SPECKING, MD

## 2024-04-28 NOTE — Progress Notes (Signed)
 Luverne Aran, MD sent to [facility] for right cervical LN biopsy under US  guidance with local anesthesia.  GY

## 2024-05-01 LAB — SURGICAL PATHOLOGY

## 2024-05-01 LAB — CYTOLOGY - NON PAP

## 2024-05-19 ENCOUNTER — Encounter: Payer: Self-pay | Admitting: Oncology

## 2024-05-19 ENCOUNTER — Inpatient Hospital Stay

## 2024-05-19 ENCOUNTER — Inpatient Hospital Stay: Attending: Oncology | Admitting: Oncology

## 2024-05-19 ENCOUNTER — Telehealth: Payer: Self-pay

## 2024-05-19 VITALS — BP 117/70 | HR 86 | Temp 98.6°F | Resp 19 | Wt 153.0 lb

## 2024-05-19 DIAGNOSIS — C8331 Diffuse large B-cell lymphoma, lymph nodes of head, face, and neck: Secondary | ICD-10-CM | POA: Insufficient documentation

## 2024-05-19 NOTE — Telephone Encounter (Signed)
 Called to request biopsy results to be faxed over from Dr. Moody office. No answer, left message with Arnaldo to fax over results. Will call to f/u tomorrow.

## 2024-05-19 NOTE — Progress Notes (Signed)
 [facility]  [phone] [phone]) [phone]  ID: Brittney Trujillo OB: 1926-12-06  MR#: [id number]  RDW#:[id number]  Patient Care Team: Lenon Layman ORN, MD as PCP - General (Internal Medicine) Jacobo Evalene PARAS, MD as Consulting Physician (Oncology)  CHIEF COMPLAINT: Diffuse large B-cell lymphoma.  INTERVAL HISTORY: Patient is a 89 year old female who recently underwent right tonsillar and cervical lymph node biopsy and pathology consistent with a diffuse large B-cell lymphoma.  She is referred for further evaluation and treatment options.  She has mild difficulty swallowing, but otherwise feels well.  She has no neurologic complaints.  She denies any recent fevers or illnesses.  She has a good appetite and denies weight loss.  She has no chest pain, shortness of breath, cough, or hemoptysis.  She denies any nausea, vomiting, constipation, or diarrhea.  She has no urinary complaints.  Patient otherwise feels well and offers no further specific complaints today.  REVIEW OF SYSTEMS:   Review of Systems  Constitutional: Negative.  Negative for fever, malaise/fatigue and weight loss.  Respiratory: Negative.  Negative for cough, hemoptysis and shortness of breath.   Cardiovascular: Negative.  Negative for chest pain and leg swelling.  Gastrointestinal: Negative.  Negative for abdominal pain.  Genitourinary: Negative.  Negative for dysuria.  Musculoskeletal: Negative.  Negative for back pain.  Skin: Negative.  Negative for rash.  Neurological: Negative.  Negative for dizziness, focal weakness, weakness and headaches.  Psychiatric/Behavioral: Negative.  The patient is not nervous/anxious.     As per HPI. Otherwise, a complete review of systems is negative.  PAST MEDICAL HISTORY: Past Medical History:  Diagnosis Date   Anemia    Anginal pain (HCC)    COPD (chronic obstructive pulmonary disease) (HCC)    GERD (gastroesophageal reflux disease)     PAST  SURGICAL HISTORY: Past Surgical History:  Procedure Laterality Date   anti reflux surgery  1995   CHOLECYSTECTOMY  1992   Lap chole   hysterectomy  1991    FAMILY HISTORY: Family History  Problem Relation Age of Onset   Dementia Mother    Stroke Mother    Stroke Father     ADVANCED DIRECTIVES (Y/N):  N  HEALTH MAINTENANCE: Social History   Tobacco Use   Smoking status: Never   Smokeless tobacco: Never  Substance Use Topics   Alcohol use: No   Drug use: No     Colonoscopy:  PAP:  Bone density:  Lipid panel:  Allergies  Allergen Reactions   Albuterol Other (See Comments)    Patient states this medication makes her have a jerking movements and very shaky.   Codeine Nausea And Vomiting    Current Outpatient Medications  Medication Sig Dispense Refill   acetaminophen  (TYLENOL ) 500 MG tablet Take 500 mg by mouth every 6 (six) hours as needed. (Patient not taking: Reported on 05/19/2024)     Calcium  Carbonate-Vitamin D  600-400 MG-UNIT tablet Take 1 tablet by mouth 2 (two) times daily. (Patient not taking: Reported on 05/19/2024)     denosumab  (PROLIA ) 60 MG/ML SOLN injection Inject 60 mg into the skin every 6 (six) months. (Patient not taking: Reported on 05/19/2024)     desloratadine (CLARINEX) 5 MG tablet Take 5 mg by mouth daily. (Patient not taking: Reported on 05/19/2024)     diltiazem  (DILACOR XR ) 120 MG 24 hr capsule Take 120 mg by mouth daily. (Patient not taking: Reported on 05/19/2024)     estrogens , conjugated, (PREMARIN ) 0.625 MG tablet Take 0.625 mg  by mouth daily.  (Patient not taking: Reported on 05/19/2024)     FLUoxetine  (PROZAC ) 10 MG capsule Take 10 mg by mouth every morning. (Patient not taking: Reported on 05/19/2024)     NEXIUM 40 MG capsule Take 40 mg by mouth 2 (two) times daily. (Patient not taking: Reported on 05/19/2024)  0   nitroGLYCERIN  (NITROSTAT ) 0.4 MG SL tablet Place 0.4 mg under the tongue every 5 (five) minutes as needed for chest pain.  (Patient not taking: Reported on 05/19/2024)     omeprazole (PRILOSEC) 20 MG capsule Take 20 mg by mouth daily. (Patient not taking: Reported on 05/19/2024)     simvastatin  (ZOCOR ) 40 MG tablet Take 40 mg by mouth at bedtime. (Patient not taking: Reported on 05/19/2024)     No current facility-administered medications for this visit.    OBJECTIVE: Vitals:   05/19/24 1522  BP: 117/70  Pulse: 86  Resp: 19  Temp: 98.6 F (37 C)  SpO2: 98%     Body mass index is 25.46 kg/m.    ECOG FS:0 - Asymptomatic  General: Well-developed, well-nourished, no acute distress.  Sitting in a wheelchair. Eyes: Pink conjunctiva, anicteric sclera. HEENT: Normocephalic, moist mucous membranes. Lungs: No audible wheezing or coughing. Heart: Regular rate and rhythm. Abdomen: Soft, nontender, no obvious distention. Musculoskeletal: No edema, cyanosis, or clubbing. Neuro: Alert, answering all questions appropriately. Cranial nerves grossly intact. Skin: No rashes or petechiae noted. Psych: Normal affect. Lymphatics: Palpable right cervical lymphadenopathy.  LAB RESULTS:  Lab Results  Component Value Date   NA 139 05/23/2021   K 3.9 05/23/2021   CL 105 05/23/2021   CO2 26 05/23/2021   GLUCOSE 164 (H) 05/23/2021   BUN 18 05/23/2021   CREATININE 1.11 (H) 05/23/2021   CALCIUM  8.9 05/23/2021   PROT 6.9 06/27/2020   ALBUMIN 3.4 (L) 06/27/2020   AST 24 06/27/2020   ALT 17 06/27/2020   ALKPHOS 47 06/27/2020   BILITOT 0.9 06/27/2020   GFRNONAA 47 (L) 05/23/2021   GFRAA 46 (L) 06/27/2020    Lab Results  Component Value Date   WBC 8.7 05/23/2021   NEUTROABS 6.5 12/27/2016   HGB 13.1 05/23/2021   HCT 38.7 05/23/2021   MCV 96.3 05/23/2021   PLT 222 05/23/2021     STUDIES: US  CORE BIOPSY (LYMPH NODES) Result Date: 04/28/2024 INDICATION: Right cervical necrotic adenopathy, imaging findings concerning for right tonsillar fossa squamous cell carcinoma EXAM: Ultrasound aspiration of the right  cervical necrotic adenopathy for cytology Ultrasound core biopsy of the right cervical necrotic adenopathy for pathology MEDICATIONS: 1% lidocaine  local ANESTHESIA/SEDATION: None. COMPLICATIONS: None immediate. PROCEDURE: Informed written consent was obtained from the patient after a thorough discussion of the procedural risks, benefits and alternatives. All questions were addressed. Maximal Sterile Barrier Technique was utilized including caps, mask, sterile gowns, sterile gloves, sterile drape, hand hygiene and skin antiseptic. A timeout was performed prior to the initiation of the procedure. Previous imaging reviewed. Preliminary ultrasound performed. The hypoechoic right neck submandibular adenopathy was localized and marked for biopsy and aspiration. This was correlated with the neck CT. Ultrasound aspiration: Under sterile conditions and local anesthesia, initially the 18 gauge needle was advanced into the right cervical adenopathy. Needle position confirmed with ultrasound. Images obtained for documentation. 18 gauge aspiration performed for cytology. Ultrasound core biopsy: Aspiration did not decompress the necrotic lymph node therefore core biopsy was also performed. Under sterile conditions and local anesthesia, the 18 gauge core biopsy needle was advanced to the necrotic adenopathy.  18 gauge core biopsies obtained through the abnormal lymph node. Samples placed on a saline moistened Telfa. Needle removed. Postprocedure imaging demonstrates no hemorrhage or hematoma. Patient tolerated the biopsy well. IMPRESSION: Successful ultrasound aspiration and core biopsy of the right cervical necrotic adenopathy. Electronically Signed   By: CHRISTELLA.  Shick M.D.   On: 04/28/2024 14:38   US  FINE NEEDLE ASP 1ST LESION Result Date: 04/28/2024 INDICATION: Right cervical necrotic adenopathy, imaging findings concerning for right tonsillar fossa squamous cell carcinoma EXAM: Ultrasound aspiration of the right cervical  necrotic adenopathy for cytology Ultrasound core biopsy of the right cervical necrotic adenopathy for pathology MEDICATIONS: 1% lidocaine  local ANESTHESIA/SEDATION: None. COMPLICATIONS: None immediate. PROCEDURE: Informed written consent was obtained from the patient after a thorough discussion of the procedural risks, benefits and alternatives. All questions were addressed. Maximal Sterile Barrier Technique was utilized including caps, mask, sterile gowns, sterile gloves, sterile drape, hand hygiene and skin antiseptic. A timeout was performed prior to the initiation of the procedure. Previous imaging reviewed. Preliminary ultrasound performed. The hypoechoic right neck submandibular adenopathy was localized and marked for biopsy and aspiration. This was correlated with the neck CT. Ultrasound aspiration: Under sterile conditions and local anesthesia, initially the 18 gauge needle was advanced into the right cervical adenopathy. Needle position confirmed with ultrasound. Images obtained for documentation. 18 gauge aspiration performed for cytology. Ultrasound core biopsy: Aspiration did not decompress the necrotic lymph node therefore core biopsy was also performed. Under sterile conditions and local anesthesia, the 18 gauge core biopsy needle was advanced to the necrotic adenopathy. 18 gauge core biopsies obtained through the abnormal lymph node. Samples placed on a saline moistened Telfa. Needle removed. Postprocedure imaging demonstrates no hemorrhage or hematoma. Patient tolerated the biopsy well. IMPRESSION: Successful ultrasound aspiration and core biopsy of the right cervical necrotic adenopathy. Electronically Signed   By: CHRISTELLA.  Shick M.D.   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  Because of this, will forego formal staging with PET  scan and bone marrow biopsy.  Patient may benefit from local control disease and referral was made to radiation oncology for further evaluation and consideration of treatment.  No further intervention is needed.  Follow-up will be based on radiation oncology evaluation and treatment.  I spent a total of 60 minutes reviewing chart data, face-to-face evaluation with the patient, counseling and coordination of care as detailed above.   Patient expressed understanding and was in agreement with this plan. She also understands that She can call clinic at any time with any questions, concerns, or complaints.    Cancer Staging  Diffuse large B-cell lymphoma (HCC) Staging form: Hodgkin and Non-Hodgkin Lymphoma, AJCC 8th Edition - Clinical stage from 05/20/2024: Stage II (Diffuse large B-cell lymphoma) - Signed by Jacobo Evalene PARAS, MD on 05/20/2024 Stage prefix: Initial diagnosis   Evalene PARAS Jacobo, MD   05/20/2024 8:30 AM

## 2024-05-20 ENCOUNTER — Encounter: Payer: Self-pay | Admitting: Unknown Physician Specialty

## 2024-05-20 DIAGNOSIS — C8330 Diffuse large B-cell lymphoma, unspecified site: Secondary | ICD-10-CM | POA: Insufficient documentation

## 2024-05-20 NOTE — Telephone Encounter (Signed)
 Kendall from Dr. Sloan office faxed over results on 8/12. Results have been uploaded to media.

## 2024-05-26 ENCOUNTER — Other Ambulatory Visit: Payer: Self-pay | Admitting: *Deleted

## 2024-05-26 ENCOUNTER — Ambulatory Visit
Admission: RE | Admit: 2024-05-26 | Discharge: 2024-05-26 | Disposition: A | Discharge status: Home / Self Care | Source: Ambulatory Visit | Attending: Radiation Oncology | Admitting: Radiation Oncology

## 2024-05-26 ENCOUNTER — Encounter: Payer: Self-pay | Admitting: Radiation Oncology

## 2024-05-26 VITALS — BP 109/73 | HR 81 | Temp 98.2°F | Resp 12

## 2024-05-26 DIAGNOSIS — K219 Gastro-esophageal reflux disease without esophagitis: Secondary | ICD-10-CM | POA: Insufficient documentation

## 2024-05-26 DIAGNOSIS — C8589 Other specified types of non-Hodgkin lymphoma, extranodal and solid organ sites: Secondary | ICD-10-CM | POA: Insufficient documentation

## 2024-05-26 DIAGNOSIS — Z79899 Other long term (current) drug therapy: Secondary | ICD-10-CM | POA: Diagnosis not present

## 2024-05-26 DIAGNOSIS — D649 Anemia, unspecified: Secondary | ICD-10-CM | POA: Diagnosis not present

## 2024-05-26 DIAGNOSIS — C8599 Non-Hodgkin lymphoma, unspecified, extranodal and solid organ sites: Secondary | ICD-10-CM

## 2024-05-26 DIAGNOSIS — J449 Chronic obstructive pulmonary disease, unspecified: Secondary | ICD-10-CM | POA: Diagnosis not present

## 2024-05-26 NOTE — Consult Note (Signed)
 NEW PATIENT EVALUATION  Name: Brittney Trujillo  MRN: [id number]  Date:   05/26/2024     DOB: [birth year]   This 89 y.o. female patient presents to the clinic for initial evaluation of diffuse large B-cell lymphoma of the left tonsil with involvement of right cervical node in 89 year old female.  REFERRING PHYSICIAN: Jacobo Evalene PARAS, MD  CHIEF COMPLAINT:  Chief Complaint  Patient presents with   tonsilar cancer    DIAGNOSIS: The encounter diagnosis was Non-Hodgkin lymphoma, unspecified, extranodal and solid organ sites [facility]).   PREVIOUS INVESTIGATIONS:  CT scan reviewed PET CT scan ordered Clinical notes reviewed Pathology reports reviewed  HPI: Patient is a 89 year old female who presented to us  with some abnormal sensation on swallowing in her throat.  She was found to have right tonsillar mass biopsy was performedShowing morphology consistent with a diffuse large B-cell lymphoma with germinal center immunophenotype.  Biopsy of an enlarged right cervical node showed necrotic specimen with atypical cells on review CD3 staining within the scattered lymphocytes within necrotic material raise the possibility of a lymphoproliferative neoplasm.  Patient did have a CT scan of the head and neck which showed nodular lesion right palatine tonsil necrotic right level 2B cervical lymph nodes suspicious for right tonsillar cancer with metastatic cervical lymphadenopathy.  She is seen today for evaluation.  She has been declined for systemic treatment based on her advanced age of 68.  She is swallowing well weight is stable she does say she has a funny feeling when she does swallow.  SheHaving no head and neck pain.  PLANNED TREATMENT REGIMEN: IMRT radiation therapy  PAST MEDICAL HISTORY:  has a past medical history of Anemia, Anginal pain (HCC), COPD (chronic obstructive pulmonary disease) (HCC), and GERD (gastroesophageal reflux disease).    PAST SURGICAL HISTORY:  Past Surgical History:   Procedure Laterality Date   anti reflux surgery  1995   CHOLECYSTECTOMY  1992   Lap chole   hysterectomy  1991    FAMILY HISTORY: family history includes Dementia in her mother; Stroke in her father and mother.  SOCIAL HISTORY:  reports that she has never smoked. She has never used smokeless tobacco. She reports that she does not drink alcohol and does not use drugs.  ALLERGIES: Albuterol and Codeine  MEDICATIONS:  Current Outpatient Medications  Medication Sig Dispense Refill   acetaminophen  (TYLENOL ) 500 MG tablet Take 500 mg by mouth every 6 (six) hours as needed. (Patient not taking: Reported on 05/26/2024)     Calcium  Carbonate-Vitamin D  600-400 MG-UNIT tablet Take 1 tablet by mouth 2 (two) times daily. (Patient not taking: Reported on 05/26/2024)     denosumab  (PROLIA ) 60 MG/ML SOLN injection Inject 60 mg into the skin every 6 (six) months. (Patient not taking: Reported on 05/26/2024)     desloratadine (CLARINEX) 5 MG tablet Take 5 mg by mouth daily. (Patient not taking: Reported on 05/26/2024)     diltiazem  (DILACOR XR ) 120 MG 24 hr capsule Take 120 mg by mouth daily. (Patient not taking: Reported on 05/26/2024)     estrogens , conjugated, (PREMARIN ) 0.625 MG tablet Take 0.625 mg by mouth daily.  (Patient not taking: Reported on 05/26/2024)     FLUoxetine  (PROZAC ) 10 MG capsule Take 10 mg by mouth every morning. (Patient not taking: Reported on 05/26/2024)     NEXIUM 40 MG capsule Take 40 mg by mouth 2 (two) times daily. (Patient not taking: Reported on 05/26/2024)  0   nitroGLYCERIN  (NITROSTAT ) 0.4 MG SL tablet  Place 0.4 mg under the tongue every 5 (five) minutes as needed for chest pain. (Patient not taking: Reported on 05/26/2024)     omeprazole (PRILOSEC) 20 MG capsule Take 20 mg by mouth daily. (Patient not taking: Reported on 05/26/2024)     simvastatin  (ZOCOR ) 40 MG tablet Take 40 mg by mouth at bedtime. (Patient not taking: Reported on 05/26/2024)     No current facility-administered  medications for this encounter.    ECOG PERFORMANCE STATUS:  1 - Symptomatic but completely ambulatory  REVIEW OF SYSTEMS: Patient denies any weight loss, fatigue, weakness, fever, chills or night sweats. Patient denies any loss of vision, blurred vision. Patient denies any ringing  of the ears or hearing loss. No irregular heartbeat. Patient denies heart murmur or history of fainting. Patient denies any chest pain or pain radiating to her upper extremities. Patient denies any shortness of breath, difficulty breathing at night, cough or hemoptysis. Patient denies any swelling in the lower legs. Patient denies any nausea vomiting, vomiting of blood, or coffee ground material in the vomitus. Patient denies any stomach pain. Patient states has had normal bowel movements no significant constipation or diarrhea. Patient denies any dysuria, hematuria or significant nocturia. Patient denies any problems walking, swelling in the joints or loss of balance. Patient denies any skin changes, loss of hair or loss of weight. Patient denies any excessive worrying or anxiety or significant depression. Patient denies any problems with insomnia. Patient denies excessive thirst, polyuria, polydipsia. Patient denies any swollen glands, patient denies easy bruising or easy bleeding. Patient denies any recent infections, allergies or URI. Patient s visual fields have not changed significantly in recent time.   PHYSICAL EXAM: BP 109/73   Pulse 81   Temp 98.2 F (36.8 C)   Resp 12   LMP  (LMP Unknown) wheelchair-bound female in NAD appears elderly and f febrile Patient has high Myotte mass in the right tonsillar region consistent with known lymphoma.  There is some enlarged adenopathy in the right cervical chain.  Well-developed well-nourished patient in NAD. HEENT reveals PERLA, EOMI, discs not visualized.  Oral cavity is clear. No oral mucosal lesions are identified. Neck is clear without evidence of cervical or  supraclavicular adenopathy. Lungs are clear to A&P. Cardiac examination is essentially unremarkable with regular rate and rhythm without murmur rub or thrill. Abdomen is benign with no organomegaly or masses noted. Motor sensory and DTR levels are equal and symmetric in the upper and lower extremities. Cranial nerves II through XII are grossly intact. Proprioception is intact. No peripheral adenopathy or edema is identified. No motor or sensory levels are noted. Crude visual fields are within normal range.  LABORATORY DATA: Pathology reports reviewed    RADIOLOGY RESULTS: CT scan of the head and neck reviewed PET scan ordered   IMPRESSION: Diffuse large B cell carcinoma the head and neck not completely staged at this time in 89 year old female  PLAN: This time of ordered a PET CT scan to better delineate areas of involvement.  Would plan on delivering 40 Gray over 4 weeks to her head and neck using IMRT treatment planning and delivery.  I would choose IMRT to spare critical structures such as her salivary glands spinal cord and normal oropharynx.  Risks and benefits of treatment including potential dysphagia sore throat skin reaction fatigue alteration of taste all were discussed in detail with the patient and her family members.  I will set up CT simulation after PET/CT scan is complete.  I  would like to take this opportunity to thank you for allowing me to participate in the care of your patient.[name] Marcey Penton, MD

## 2024-06-06 ENCOUNTER — Ambulatory Visit
Admission: RE | Admit: 2024-06-06 | Discharge: 2024-06-06 | Disposition: A | Discharge status: Home / Self Care | Source: Ambulatory Visit | Attending: Radiation Oncology | Admitting: Radiation Oncology

## 2024-06-06 DIAGNOSIS — C8599 Non-Hodgkin lymphoma, unspecified, extranodal and solid organ sites: Secondary | ICD-10-CM | POA: Diagnosis present

## 2024-06-06 DIAGNOSIS — I700 Atherosclerosis of aorta: Secondary | ICD-10-CM | POA: Insufficient documentation

## 2024-06-06 DIAGNOSIS — K449 Diaphragmatic hernia without obstruction or gangrene: Secondary | ICD-10-CM | POA: Insufficient documentation

## 2024-06-06 DIAGNOSIS — I2510 Atherosclerotic heart disease of native coronary artery without angina pectoris: Secondary | ICD-10-CM | POA: Diagnosis not present

## 2024-06-06 LAB — GLUCOSE, CAPILLARY: Glucose-Capillary: 85 mg/dL (ref 70–99)

## 2024-06-06 MED ORDER — FLUDEOXYGLUCOSE F - 18 (FDG) INJECTION
8.7000 | Freq: Once | INTRAVENOUS | Status: AC | PRN
  Administered 2024-06-06: 8.7 via INTRAVENOUS

## 2024-06-12 ENCOUNTER — Ambulatory Visit
Admission: RE | Admit: 2024-06-12 | Discharge: 2024-06-12 | Disposition: A | Discharge status: Home / Self Care | Source: Ambulatory Visit | Attending: Radiation Oncology | Admitting: Radiation Oncology

## 2024-06-12 DIAGNOSIS — C8589 Other specified types of non-Hodgkin lymphoma, extranodal and solid organ sites: Secondary | ICD-10-CM | POA: Insufficient documentation

## 2024-06-12 DIAGNOSIS — J449 Chronic obstructive pulmonary disease, unspecified: Secondary | ICD-10-CM | POA: Insufficient documentation

## 2024-06-12 DIAGNOSIS — D649 Anemia, unspecified: Secondary | ICD-10-CM | POA: Insufficient documentation

## 2024-06-12 DIAGNOSIS — Z79899 Other long term (current) drug therapy: Secondary | ICD-10-CM | POA: Insufficient documentation

## 2024-06-12 DIAGNOSIS — K219 Gastro-esophageal reflux disease without esophagitis: Secondary | ICD-10-CM | POA: Insufficient documentation

## 2024-06-17 ENCOUNTER — Other Ambulatory Visit: Payer: Self-pay | Admitting: *Deleted

## 2024-06-17 DIAGNOSIS — C8599 Non-Hodgkin lymphoma, unspecified, extranodal and solid organ sites: Secondary | ICD-10-CM

## 2024-06-19 DIAGNOSIS — C8589 Other specified types of non-Hodgkin lymphoma, extranodal and solid organ sites: Secondary | ICD-10-CM | POA: Diagnosis not present

## 2024-06-23 ENCOUNTER — Ambulatory Visit
Admission: RE | Admit: 2024-06-23 | Discharge: 2024-06-23 | Disposition: A | Discharge status: Home / Self Care | Source: Ambulatory Visit | Attending: Radiation Oncology | Admitting: Radiation Oncology

## 2024-06-24 ENCOUNTER — Other Ambulatory Visit: Payer: Self-pay

## 2024-06-24 ENCOUNTER — Ambulatory Visit

## 2024-06-24 ENCOUNTER — Ambulatory Visit
Admission: RE | Admit: 2024-06-24 | Discharge: 2024-06-24 | Disposition: A | Discharge status: Home / Self Care | Source: Ambulatory Visit | Attending: Radiation Oncology | Admitting: Radiation Oncology

## 2024-06-24 DIAGNOSIS — C8589 Other specified types of non-Hodgkin lymphoma, extranodal and solid organ sites: Secondary | ICD-10-CM | POA: Diagnosis not present

## 2024-06-24 LAB — RAD ONC ARIA SESSION SUMMARY
Course Elapsed Days: 0
Plan Fractions Treated to Date: 1
Plan Prescribed Dose Per Fraction: 2 Gy
Plan Total Fractions Prescribed: 20
Plan Total Prescribed Dose: 40 Gy
Reference Point Dosage Given to Date: 2 Gy
Reference Point Session Dosage Given: 2 Gy
Session Number: 1

## 2024-06-25 ENCOUNTER — Other Ambulatory Visit: Payer: Self-pay

## 2024-06-25 ENCOUNTER — Ambulatory Visit
Admission: RE | Admit: 2024-06-25 | Discharge: 2024-06-25 | Discharge status: Home / Self Care | Source: Ambulatory Visit | Attending: Radiation Oncology | Admitting: Radiation Oncology

## 2024-06-25 ENCOUNTER — Ambulatory Visit

## 2024-06-25 DIAGNOSIS — C8589 Other specified types of non-Hodgkin lymphoma, extranodal and solid organ sites: Secondary | ICD-10-CM | POA: Diagnosis not present

## 2024-06-25 LAB — RAD ONC ARIA SESSION SUMMARY
Course Elapsed Days: 1
Plan Fractions Treated to Date: 2
Plan Prescribed Dose Per Fraction: 2 Gy
Plan Total Fractions Prescribed: 20
Plan Total Prescribed Dose: 40 Gy
Reference Point Dosage Given to Date: 4 Gy
Reference Point Session Dosage Given: 2 Gy
Session Number: 2

## 2024-06-26 ENCOUNTER — Inpatient Hospital Stay

## 2024-06-26 ENCOUNTER — Ambulatory Visit
Admission: RE | Admit: 2024-06-26 | Discharge: 2024-06-26 | Disposition: A | Discharge status: Home / Self Care | Source: Ambulatory Visit | Attending: Radiation Oncology | Admitting: Radiation Oncology

## 2024-06-26 ENCOUNTER — Other Ambulatory Visit: Payer: Self-pay

## 2024-06-26 DIAGNOSIS — C8331 Diffuse large B-cell lymphoma, lymph nodes of head, face, and neck: Secondary | ICD-10-CM | POA: Insufficient documentation

## 2024-06-26 DIAGNOSIS — C8589 Other specified types of non-Hodgkin lymphoma, extranodal and solid organ sites: Secondary | ICD-10-CM | POA: Diagnosis not present

## 2024-06-26 DIAGNOSIS — C8599 Non-Hodgkin lymphoma, unspecified, extranodal and solid organ sites: Secondary | ICD-10-CM

## 2024-06-26 LAB — CBC (CANCER CENTER ONLY)
HCT: 41.2 % (ref 36.0–46.0)
Hemoglobin: 13.5 g/dL (ref 12.0–15.0)
MCH: 30.8 pg (ref 26.0–34.0)
MCHC: 32.8 g/dL (ref 30.0–36.0)
MCV: 93.8 fL (ref 80.0–100.0)
Platelet Count: 235 K/uL (ref 150–400)
RBC: 4.39 MIL/uL (ref 3.87–5.11)
RDW: 14.6 % (ref 11.5–15.5)
WBC Count: 7.2 K/uL (ref 4.0–10.5)
nRBC: 0 % (ref 0.0–0.2)

## 2024-06-26 LAB — RAD ONC ARIA SESSION SUMMARY
Course Elapsed Days: 2
Plan Fractions Treated to Date: 3
Plan Prescribed Dose Per Fraction: 2 Gy
Plan Total Fractions Prescribed: 20
Plan Total Prescribed Dose: 40 Gy
Reference Point Dosage Given to Date: 6 Gy
Reference Point Session Dosage Given: 2 Gy
Session Number: 3

## 2024-06-27 ENCOUNTER — Other Ambulatory Visit: Payer: Self-pay

## 2024-06-27 ENCOUNTER — Ambulatory Visit
Admission: RE | Admit: 2024-06-27 | Discharge: 2024-06-27 | Disposition: A | Discharge status: Home / Self Care | Source: Ambulatory Visit | Attending: Radiation Oncology | Admitting: Radiation Oncology

## 2024-06-27 DIAGNOSIS — C8589 Other specified types of non-Hodgkin lymphoma, extranodal and solid organ sites: Secondary | ICD-10-CM | POA: Diagnosis not present

## 2024-06-27 LAB — RAD ONC ARIA SESSION SUMMARY
Course Elapsed Days: 3
Plan Fractions Treated to Date: 4
Plan Prescribed Dose Per Fraction: 2 Gy
Plan Total Fractions Prescribed: 20
Plan Total Prescribed Dose: 40 Gy
Reference Point Dosage Given to Date: 8 Gy
Reference Point Session Dosage Given: 2 Gy
Session Number: 4

## 2024-06-30 ENCOUNTER — Ambulatory Visit
Admission: RE | Admit: 2024-06-30 | Discharge: 2024-06-30 | Disposition: A | Discharge status: Home / Self Care | Source: Ambulatory Visit | Attending: Radiation Oncology | Admitting: Radiation Oncology

## 2024-06-30 ENCOUNTER — Other Ambulatory Visit: Payer: Self-pay

## 2024-06-30 DIAGNOSIS — C8589 Other specified types of non-Hodgkin lymphoma, extranodal and solid organ sites: Secondary | ICD-10-CM | POA: Diagnosis not present

## 2024-06-30 LAB — RAD ONC ARIA SESSION SUMMARY
Course Elapsed Days: 6
Plan Fractions Treated to Date: 5
Plan Prescribed Dose Per Fraction: 2 Gy
Plan Total Fractions Prescribed: 20
Plan Total Prescribed Dose: 40 Gy
Reference Point Dosage Given to Date: 10 Gy
Reference Point Session Dosage Given: 2 Gy
Session Number: 5

## 2024-07-01 ENCOUNTER — Ambulatory Visit
Admission: RE | Admit: 2024-07-01 | Discharge: 2024-07-01 | Discharge status: Home / Self Care | Source: Ambulatory Visit | Attending: Radiation Oncology | Admitting: Radiation Oncology

## 2024-07-01 ENCOUNTER — Other Ambulatory Visit: Payer: Self-pay

## 2024-07-01 DIAGNOSIS — C8589 Other specified types of non-Hodgkin lymphoma, extranodal and solid organ sites: Secondary | ICD-10-CM | POA: Diagnosis not present

## 2024-07-01 LAB — RAD ONC ARIA SESSION SUMMARY
Course Elapsed Days: 7
Plan Fractions Treated to Date: 6
Plan Prescribed Dose Per Fraction: 2 Gy
Plan Total Fractions Prescribed: 20
Plan Total Prescribed Dose: 40 Gy
Reference Point Dosage Given to Date: 12 Gy
Reference Point Session Dosage Given: 2 Gy
Session Number: 6

## 2024-07-02 ENCOUNTER — Other Ambulatory Visit: Payer: Self-pay

## 2024-07-02 ENCOUNTER — Ambulatory Visit
Admission: RE | Admit: 2024-07-02 | Discharge: 2024-07-02 | Disposition: A | Discharge status: Home / Self Care | Source: Ambulatory Visit | Attending: Radiation Oncology | Admitting: Radiation Oncology

## 2024-07-02 DIAGNOSIS — C8589 Other specified types of non-Hodgkin lymphoma, extranodal and solid organ sites: Secondary | ICD-10-CM | POA: Diagnosis not present

## 2024-07-02 LAB — RAD ONC ARIA SESSION SUMMARY
Course Elapsed Days: 8
Plan Fractions Treated to Date: 7
Plan Prescribed Dose Per Fraction: 2 Gy
Plan Total Fractions Prescribed: 20
Plan Total Prescribed Dose: 40 Gy
Reference Point Dosage Given to Date: 14 Gy
Reference Point Session Dosage Given: 2 Gy
Session Number: 7

## 2024-07-03 ENCOUNTER — Other Ambulatory Visit: Payer: Self-pay

## 2024-07-03 ENCOUNTER — Inpatient Hospital Stay

## 2024-07-03 ENCOUNTER — Ambulatory Visit
Admission: RE | Admit: 2024-07-03 | Discharge: 2024-07-03 | Disposition: A | Discharge status: Home / Self Care | Source: Ambulatory Visit | Attending: Radiation Oncology | Admitting: Radiation Oncology

## 2024-07-03 DIAGNOSIS — C8589 Other specified types of non-Hodgkin lymphoma, extranodal and solid organ sites: Secondary | ICD-10-CM | POA: Diagnosis not present

## 2024-07-03 DIAGNOSIS — C8599 Non-Hodgkin lymphoma, unspecified, extranodal and solid organ sites: Secondary | ICD-10-CM

## 2024-07-03 LAB — RAD ONC ARIA SESSION SUMMARY
Course Elapsed Days: 9
Plan Fractions Treated to Date: 8
Plan Prescribed Dose Per Fraction: 2 Gy
Plan Total Fractions Prescribed: 20
Plan Total Prescribed Dose: 40 Gy
Reference Point Dosage Given to Date: 16 Gy
Reference Point Session Dosage Given: 2 Gy
Session Number: 8

## 2024-07-03 LAB — CBC (CANCER CENTER ONLY)
HCT: 39.7 % (ref 36.0–46.0)
Hemoglobin: 13.3 g/dL (ref 12.0–15.0)
MCH: 31.4 pg (ref 26.0–34.0)
MCHC: 33.5 g/dL (ref 30.0–36.0)
MCV: 93.6 fL (ref 80.0–100.0)
Platelet Count: 221 K/uL (ref 150–400)
RBC: 4.24 MIL/uL (ref 3.87–5.11)
RDW: 14.5 % (ref 11.5–15.5)
WBC Count: 6 K/uL (ref 4.0–10.5)
nRBC: 0 % (ref 0.0–0.2)

## 2024-07-04 ENCOUNTER — Ambulatory Visit
Admission: RE | Admit: 2024-07-04 | Discharge: 2024-07-04 | Disposition: A | Discharge status: Home / Self Care | Source: Ambulatory Visit | Attending: Radiation Oncology | Admitting: Radiation Oncology

## 2024-07-04 ENCOUNTER — Other Ambulatory Visit: Payer: Self-pay

## 2024-07-04 DIAGNOSIS — C8589 Other specified types of non-Hodgkin lymphoma, extranodal and solid organ sites: Secondary | ICD-10-CM | POA: Diagnosis not present

## 2024-07-04 LAB — RAD ONC ARIA SESSION SUMMARY
Course Elapsed Days: 10
Plan Fractions Treated to Date: 9
Plan Prescribed Dose Per Fraction: 2 Gy
Plan Total Fractions Prescribed: 20
Plan Total Prescribed Dose: 40 Gy
Reference Point Dosage Given to Date: 18 Gy
Reference Point Session Dosage Given: 2 Gy
Session Number: 9

## 2024-07-07 ENCOUNTER — Ambulatory Visit
Admission: RE | Admit: 2024-07-07 | Discharge: 2024-07-07 | Disposition: A | Discharge status: Home / Self Care | Source: Ambulatory Visit | Attending: Radiation Oncology | Admitting: Radiation Oncology

## 2024-07-07 ENCOUNTER — Other Ambulatory Visit: Payer: Self-pay

## 2024-07-07 DIAGNOSIS — C8589 Other specified types of non-Hodgkin lymphoma, extranodal and solid organ sites: Secondary | ICD-10-CM | POA: Diagnosis not present

## 2024-07-07 LAB — RAD ONC ARIA SESSION SUMMARY
Course Elapsed Days: 13
Plan Fractions Treated to Date: 10
Plan Prescribed Dose Per Fraction: 2 Gy
Plan Total Fractions Prescribed: 20
Plan Total Prescribed Dose: 40 Gy
Reference Point Dosage Given to Date: 20 Gy
Reference Point Session Dosage Given: 2 Gy
Session Number: 10

## 2024-07-08 ENCOUNTER — Other Ambulatory Visit: Payer: Self-pay | Admitting: *Deleted

## 2024-07-08 ENCOUNTER — Other Ambulatory Visit: Payer: Self-pay

## 2024-07-08 ENCOUNTER — Ambulatory Visit
Admission: RE | Admit: 2024-07-08 | Discharge: 2024-07-08 | Disposition: A | Discharge status: Home / Self Care | Source: Ambulatory Visit | Attending: Radiation Oncology | Admitting: Radiation Oncology

## 2024-07-08 DIAGNOSIS — C8589 Other specified types of non-Hodgkin lymphoma, extranodal and solid organ sites: Secondary | ICD-10-CM | POA: Diagnosis not present

## 2024-07-08 LAB — RAD ONC ARIA SESSION SUMMARY
Course Elapsed Days: 14
Plan Fractions Treated to Date: 11
Plan Prescribed Dose Per Fraction: 2 Gy
Plan Total Fractions Prescribed: 20
Plan Total Prescribed Dose: 40 Gy
Reference Point Dosage Given to Date: 22 Gy
Reference Point Session Dosage Given: 2 Gy
Session Number: 11

## 2024-07-08 MED ORDER — SUCRALFATE 1 G PO TABS
1.0000 g | ORAL_TABLET | Freq: Three times a day (TID) | ORAL | 0 refills | Status: DC
Start: 2024-07-08 — End: 2024-08-04

## 2024-07-09 ENCOUNTER — Ambulatory Visit

## 2024-07-10 ENCOUNTER — Ambulatory Visit

## 2024-07-10 ENCOUNTER — Inpatient Hospital Stay

## 2024-07-11 ENCOUNTER — Ambulatory Visit

## 2024-07-14 ENCOUNTER — Other Ambulatory Visit: Payer: Self-pay

## 2024-07-14 ENCOUNTER — Ambulatory Visit
Admission: RE | Admit: 2024-07-14 | Discharge: 2024-07-14 | Disposition: A | Discharge status: Home / Self Care | Source: Ambulatory Visit | Attending: Radiation Oncology | Admitting: Radiation Oncology

## 2024-07-14 DIAGNOSIS — J449 Chronic obstructive pulmonary disease, unspecified: Secondary | ICD-10-CM | POA: Insufficient documentation

## 2024-07-14 DIAGNOSIS — K219 Gastro-esophageal reflux disease without esophagitis: Secondary | ICD-10-CM | POA: Insufficient documentation

## 2024-07-14 DIAGNOSIS — C8589 Other specified types of non-Hodgkin lymphoma, extranodal and solid organ sites: Secondary | ICD-10-CM | POA: Insufficient documentation

## 2024-07-14 DIAGNOSIS — Z79899 Other long term (current) drug therapy: Secondary | ICD-10-CM | POA: Insufficient documentation

## 2024-07-14 DIAGNOSIS — D649 Anemia, unspecified: Secondary | ICD-10-CM | POA: Insufficient documentation

## 2024-07-14 LAB — RAD ONC ARIA SESSION SUMMARY
Course Elapsed Days: 20
Plan Fractions Treated to Date: 12
Plan Prescribed Dose Per Fraction: 2 Gy
Plan Total Fractions Prescribed: 20
Plan Total Prescribed Dose: 40 Gy
Reference Point Dosage Given to Date: 24 Gy
Reference Point Session Dosage Given: 2 Gy
Session Number: 12

## 2024-07-15 ENCOUNTER — Other Ambulatory Visit: Payer: Self-pay

## 2024-07-15 ENCOUNTER — Ambulatory Visit
Admission: RE | Admit: 2024-07-15 | Discharge: 2024-07-15 | Disposition: A | Discharge status: Home / Self Care | Source: Ambulatory Visit | Attending: Radiation Oncology | Admitting: Radiation Oncology

## 2024-07-15 DIAGNOSIS — C8589 Other specified types of non-Hodgkin lymphoma, extranodal and solid organ sites: Secondary | ICD-10-CM | POA: Diagnosis not present

## 2024-07-15 LAB — RAD ONC ARIA SESSION SUMMARY
Course Elapsed Days: 21
Plan Fractions Treated to Date: 13
Plan Prescribed Dose Per Fraction: 2 Gy
Plan Total Fractions Prescribed: 20
Plan Total Prescribed Dose: 40 Gy
Reference Point Dosage Given to Date: 26 Gy
Reference Point Session Dosage Given: 2 Gy
Session Number: 13

## 2024-07-16 ENCOUNTER — Ambulatory Visit
Admission: RE | Admit: 2024-07-16 | Discharge: 2024-07-16 | Disposition: A | Source: Ambulatory Visit | Attending: Radiation Oncology | Admitting: Radiation Oncology

## 2024-07-16 ENCOUNTER — Other Ambulatory Visit: Payer: Self-pay

## 2024-07-16 DIAGNOSIS — C8589 Other specified types of non-Hodgkin lymphoma, extranodal and solid organ sites: Secondary | ICD-10-CM | POA: Diagnosis not present

## 2024-07-16 LAB — RAD ONC ARIA SESSION SUMMARY
Course Elapsed Days: 22
Plan Fractions Treated to Date: 14
Plan Prescribed Dose Per Fraction: 2 Gy
Plan Total Fractions Prescribed: 20
Plan Total Prescribed Dose: 40 Gy
Reference Point Dosage Given to Date: 28 Gy
Reference Point Session Dosage Given: 2 Gy
Session Number: 14

## 2024-07-17 ENCOUNTER — Ambulatory Visit
Admission: RE | Admit: 2024-07-17 | Discharge: 2024-07-17 | Disposition: A | Source: Ambulatory Visit | Attending: Radiation Oncology | Admitting: Radiation Oncology

## 2024-07-17 ENCOUNTER — Other Ambulatory Visit: Payer: Self-pay

## 2024-07-17 DIAGNOSIS — C8589 Other specified types of non-Hodgkin lymphoma, extranodal and solid organ sites: Secondary | ICD-10-CM | POA: Diagnosis not present

## 2024-07-17 LAB — RAD ONC ARIA SESSION SUMMARY
Course Elapsed Days: 23
Plan Fractions Treated to Date: 15
Plan Prescribed Dose Per Fraction: 2 Gy
Plan Total Fractions Prescribed: 20
Plan Total Prescribed Dose: 40 Gy
Reference Point Dosage Given to Date: 30 Gy
Reference Point Session Dosage Given: 2 Gy
Session Number: 15

## 2024-07-18 ENCOUNTER — Ambulatory Visit
Admission: RE | Admit: 2024-07-18 | Discharge: 2024-07-18 | Disposition: A | Discharge status: Home / Self Care | Source: Ambulatory Visit | Attending: Radiation Oncology | Admitting: Radiation Oncology

## 2024-07-18 ENCOUNTER — Other Ambulatory Visit: Payer: Self-pay

## 2024-07-18 DIAGNOSIS — C8589 Other specified types of non-Hodgkin lymphoma, extranodal and solid organ sites: Secondary | ICD-10-CM | POA: Diagnosis not present

## 2024-07-18 LAB — RAD ONC ARIA SESSION SUMMARY
Course Elapsed Days: 24
Plan Fractions Treated to Date: 16
Plan Prescribed Dose Per Fraction: 2 Gy
Plan Total Fractions Prescribed: 20
Plan Total Prescribed Dose: 40 Gy
Reference Point Dosage Given to Date: 32 Gy
Reference Point Session Dosage Given: 2 Gy
Session Number: 16

## 2024-07-21 ENCOUNTER — Other Ambulatory Visit: Payer: Self-pay

## 2024-07-21 ENCOUNTER — Ambulatory Visit
Admission: RE | Admit: 2024-07-21 | Discharge: 2024-07-21 | Disposition: A | Source: Ambulatory Visit | Attending: Radiation Oncology | Admitting: Radiation Oncology

## 2024-07-21 ENCOUNTER — Ambulatory Visit

## 2024-07-21 DIAGNOSIS — C8589 Other specified types of non-Hodgkin lymphoma, extranodal and solid organ sites: Secondary | ICD-10-CM | POA: Diagnosis not present

## 2024-07-21 LAB — RAD ONC ARIA SESSION SUMMARY
Course Elapsed Days: 27
Plan Fractions Treated to Date: 17
Plan Prescribed Dose Per Fraction: 2 Gy
Plan Total Fractions Prescribed: 20
Plan Total Prescribed Dose: 40 Gy
Reference Point Dosage Given to Date: 34 Gy
Reference Point Session Dosage Given: 2 Gy
Session Number: 17

## 2024-07-22 ENCOUNTER — Other Ambulatory Visit: Payer: Self-pay

## 2024-07-22 ENCOUNTER — Ambulatory Visit
Admission: RE | Admit: 2024-07-22 | Discharge: 2024-07-22 | Disposition: A | Source: Ambulatory Visit | Attending: Radiation Oncology | Admitting: Radiation Oncology

## 2024-07-22 DIAGNOSIS — C8589 Other specified types of non-Hodgkin lymphoma, extranodal and solid organ sites: Secondary | ICD-10-CM | POA: Diagnosis not present

## 2024-07-22 LAB — RAD ONC ARIA SESSION SUMMARY
Course Elapsed Days: 28
Plan Fractions Treated to Date: 18
Plan Prescribed Dose Per Fraction: 2 Gy
Plan Total Fractions Prescribed: 20
Plan Total Prescribed Dose: 40 Gy
Reference Point Dosage Given to Date: 36 Gy
Reference Point Session Dosage Given: 2 Gy
Session Number: 18

## 2024-07-23 ENCOUNTER — Other Ambulatory Visit: Payer: Self-pay

## 2024-07-23 ENCOUNTER — Ambulatory Visit
Admission: RE | Admit: 2024-07-23 | Discharge: 2024-07-23 | Disposition: A | Discharge status: Home / Self Care | Source: Ambulatory Visit | Attending: Radiation Oncology | Admitting: Radiation Oncology

## 2024-07-23 DIAGNOSIS — C8589 Other specified types of non-Hodgkin lymphoma, extranodal and solid organ sites: Secondary | ICD-10-CM | POA: Diagnosis not present

## 2024-07-23 LAB — RAD ONC ARIA SESSION SUMMARY
Course Elapsed Days: 29
Plan Fractions Treated to Date: 19
Plan Prescribed Dose Per Fraction: 2 Gy
Plan Total Fractions Prescribed: 20
Plan Total Prescribed Dose: 40 Gy
Reference Point Dosage Given to Date: 38 Gy
Reference Point Session Dosage Given: 2 Gy
Session Number: 19

## 2024-07-24 ENCOUNTER — Ambulatory Visit
Admission: RE | Admit: 2024-07-24 | Discharge: 2024-07-24 | Disposition: A | Discharge status: Home / Self Care | Source: Ambulatory Visit | Attending: Radiation Oncology | Admitting: Radiation Oncology

## 2024-07-24 ENCOUNTER — Other Ambulatory Visit: Payer: Self-pay

## 2024-07-24 DIAGNOSIS — C8589 Other specified types of non-Hodgkin lymphoma, extranodal and solid organ sites: Secondary | ICD-10-CM | POA: Diagnosis not present

## 2024-07-24 LAB — RAD ONC ARIA SESSION SUMMARY
Course Elapsed Days: 30
Plan Fractions Treated to Date: 20
Plan Prescribed Dose Per Fraction: 2 Gy
Plan Total Fractions Prescribed: 20
Plan Total Prescribed Dose: 40 Gy
Reference Point Dosage Given to Date: 40 Gy
Reference Point Session Dosage Given: 2 Gy
Session Number: 20

## 2024-07-25 NOTE — Radiation Completion Notes (Signed)
 Patient Name: Brittney Trujillo, Brittney Trujillo MRN: [id number] Date of Birth: 1927-06-14 Referring Physician: EVALENE REUSING, M.D. Date of Service: 2024-07-25 Radiation Oncologist: Marcey Penton, M.D. [facility]                             RADIATION ONCOLOGY END OF TREATMENT NOTE     Diagnosis: C85.89 Other specified types of non-hodgkin lymphoma, extranodal and solid organ sites Intent: Curative     HPI: Patient is a 89 year old female who presented to us  with some abnormal sensation on swallowing in her throat.  She was found to have right tonsillar mass biopsy was performedShowing morphology consistent with a diffuse large B-cell lymphoma with germinal center immunophenotype.  Biopsy of an enlarged right cervical node showed necrotic specimen with atypical cells on review CD3 staining within the scattered lymphocytes within necrotic material raise the possibility of a lymphoproliferative neoplasm.  Patient did have a CT scan of the head and neck which showed nodular lesion right palatine tonsil necrotic right level 2B cervical lymph nodes suspicious for right tonsillar cancer with metastatic cervical lymphadenopathy.  She is seen today for evaluation.  She has been declined for systemic treatment based on her advanced age of 9.  She is swallowing well weight is stable she does say she has a funny feeling when she does swallow.  DyzYjcpwh no head and neck pain.      ==========DELIVERED PLANS==========  First Treatment Date: 2024-06-24 Last Treatment Date: 2024-07-24   Plan Name: [id number] Site: Tonsil, Right Technique: IMRT Mode: Photon Dose Per Fraction: 2 Gy Prescribed Dose (Delivered / Prescribed): 40 Gy / 40 Gy Prescribed Fxs (Delivered / Prescribed): 20 / 20     ==========ON TREATMENT VISIT DATES========== 2024-06-24, 2024-07-01, 2024-07-08, 2024-07-14, 2024-07-15, 2024-07-22     ==========UPCOMING VISITS========== 09/01/2024 [facility] RAD ONCOLOGY FOLLOW UP  30 Penton Marcey, MD  07/30/2024 [facility] MED ONC EST PT REUSING EVALENE PARAS, MD        ==========APPENDIX - ON TREATMENT VISIT NOTES==========   See weekly On Treatment Notes in Epic for details in the Media tab (listed as Progress notes on the On Treatment Visit Dates listed above).

## 2024-07-28 ENCOUNTER — Inpatient Hospital Stay: Admitting: Oncology

## 2024-07-30 ENCOUNTER — Encounter: Payer: Self-pay | Admitting: Oncology

## 2024-07-30 ENCOUNTER — Inpatient Hospital Stay: Attending: Oncology | Admitting: Oncology

## 2024-07-30 VITALS — BP 119/61 | HR 68 | Temp 98.0°F | Resp 18 | Wt 156.0 lb

## 2024-07-30 DIAGNOSIS — R682 Dry mouth, unspecified: Secondary | ICD-10-CM | POA: Diagnosis not present

## 2024-07-30 DIAGNOSIS — R1310 Dysphagia, unspecified: Secondary | ICD-10-CM | POA: Insufficient documentation

## 2024-07-30 DIAGNOSIS — Z923 Personal history of irradiation: Secondary | ICD-10-CM | POA: Insufficient documentation

## 2024-07-30 DIAGNOSIS — R21 Rash and other nonspecific skin eruption: Secondary | ICD-10-CM | POA: Diagnosis not present

## 2024-07-30 DIAGNOSIS — C8331 Diffuse large B-cell lymphoma, lymph nodes of head, face, and neck: Secondary | ICD-10-CM | POA: Diagnosis present

## 2024-07-30 NOTE — Progress Notes (Unsigned)
 [facility]  Telephone:(336) [phone] [phone]) [phone]  ID: Brittney Trujillo OB: 1926/11/13  MR#: [id number]  RDW#:[id number]  Patient Care Team: Lenon Layman ORN, MD as PCP - General (Internal Medicine) Jacobo Evalene PARAS, MD as Consulting Physician (Oncology) Lenn Aran, MD as Consulting Physician (Radiation Oncology)  CHIEF COMPLAINT: Diffuse large B-cell lymphoma.  INTERVAL HISTORY: Patient returns to clinic today for further evaluation after completing XRT.  She has a rash, and dry mouth secondary to her treatments, but otherwise tolerated them well.  She continues to have mild dysphagia but attributes this to her dry mouth. She has no neurologic complaints.  She denies any recent fevers or illnesses.  She has a poor appetite but denies weight loss.  She has no chest pain, shortness of breath, cough, or hemoptysis.  She denies any nausea, vomiting, constipation, or diarrhea.  She has no urinary complaints.  Patient offers no further specific complaints today.  REVIEW OF SYSTEMS:   Review of Systems  Constitutional: Negative.  Negative for fever, malaise/fatigue and weight loss.  Respiratory: Negative.  Negative for cough, hemoptysis and shortness of breath.   Cardiovascular: Negative.  Negative for chest pain and leg swelling.  Gastrointestinal: Negative.  Negative for abdominal pain.  Genitourinary: Negative.  Negative for dysuria.  Musculoskeletal: Negative.  Negative for back pain.  Skin: Negative.  Negative for rash.  Neurological: Negative.  Negative for dizziness, focal weakness, weakness and headaches.  Psychiatric/Behavioral: Negative.  The patient is not nervous/anxious.     As per HPI. Otherwise, a complete review of systems is negative.  PAST MEDICAL HISTORY: Past Medical History:  Diagnosis Date   Anemia    Anginal pain    COPD (chronic obstructive pulmonary disease) (HCC)    GERD (gastroesophageal reflux disease)     PAST SURGICAL  HISTORY: Past Surgical History:  Procedure Laterality Date   anti reflux surgery  1995   CHOLECYSTECTOMY  1992   Lap chole   hysterectomy  1991    FAMILY HISTORY: Family History  Problem Relation Age of Onset   Dementia Mother    Stroke Mother    Stroke Father     ADVANCED DIRECTIVES (Y/N):  N  HEALTH MAINTENANCE: Social History   Tobacco Use   Smoking status: Never   Smokeless tobacco: Never  Substance Use Topics   Alcohol use: No   Drug use: No     Colonoscopy:  PAP:  Bone density:  Lipid panel:  Allergies  Allergen Reactions   Albuterol Other (See Comments)    Patient states this medication makes her have a jerking movements and very shaky.   Codeine Nausea And Vomiting    Current Outpatient Medications  Medication Sig Dispense Refill   acetaminophen  (TYLENOL ) 500 MG tablet Take 500 mg by mouth every 6 (six) hours as needed. (Patient not taking: Reported on 07/30/2024)     Calcium  Carbonate-Vitamin D  600-400 MG-UNIT tablet Take 1 tablet by mouth 2 (two) times daily. (Patient not taking: Reported on 07/30/2024)     denosumab  (PROLIA ) 60 MG/ML SOLN injection Inject 60 mg into the skin every 6 (six) months. (Patient not taking: Reported on 07/30/2024)     desloratadine (CLARINEX) 5 MG tablet Take 5 mg by mouth daily. (Patient not taking: Reported on 07/30/2024)     diltiazem  (DILACOR XR ) 120 MG 24 hr capsule Take 120 mg by mouth daily. (Patient not taking: Reported on 07/30/2024)     estrogens , conjugated, (PREMARIN ) 0.625 MG tablet Take 0.625 mg  by mouth daily.  (Patient not taking: Reported on 07/30/2024)     FLUoxetine  (PROZAC ) 10 MG capsule Take 10 mg by mouth every morning. (Patient not taking: Reported on 07/30/2024)     NEXIUM 40 MG capsule Take 40 mg by mouth 2 (two) times daily. (Patient not taking: Reported on 07/30/2024)  0   nitroGLYCERIN  (NITROSTAT ) 0.4 MG SL tablet Place 0.4 mg under the tongue every 5 (five) minutes as needed for chest pain. (Patient  not taking: Reported on 07/30/2024)     omeprazole (PRILOSEC) 20 MG capsule Take 20 mg by mouth daily. (Patient not taking: Reported on 07/30/2024)     simvastatin  (ZOCOR ) 40 MG tablet Take 40 mg by mouth at bedtime. (Patient not taking: Reported on 07/30/2024)     sucralfate (CARAFATE) 1 g tablet Take 1 tablet (1 g total) by mouth 3 (three) times daily before meals. (Patient not taking: Reported on 07/30/2024) 90 tablet 0   No current facility-administered medications for this visit.    OBJECTIVE: Vitals:   07/30/24 1442  BP: 119/61  Pulse: 68  Resp: 18  Temp: 98 F (36.7 C)  SpO2: 98%     Body mass index is 25.96 kg/m.    ECOG FS:0 - Asymptomatic  General: Well-developed, well-nourished, no acute distress.  Sitting in wheelchair. Eyes: Pink conjunctiva, anicteric sclera. HEENT: Normocephalic, moist mucous membranes. Lungs: No audible wheezing or coughing. Heart: Regular rate and rhythm. Abdomen: Soft, nontender, no obvious distention. Musculoskeletal: No edema, cyanosis, or clubbing. Neuro: Alert, answering all questions appropriately. Cranial nerves grossly intact. Skin: No rashes or petechiae noted. Psych: Normal affect. Lymphatics: Right cervical lymphadenopathy no longer palpable.  LAB RESULTS:  Lab Results  Component Value Date   NA 139 05/23/2021   K 3.9 05/23/2021   CL 105 05/23/2021   CO2 26 05/23/2021   GLUCOSE 164 (H) 05/23/2021   BUN 18 05/23/2021   CREATININE 1.11 (H) 05/23/2021   CALCIUM  8.9 05/23/2021   PROT 6.9 06/27/2020   ALBUMIN 3.4 (L) 06/27/2020   AST 24 06/27/2020   ALT 17 06/27/2020   ALKPHOS 47 06/27/2020   BILITOT 0.9 06/27/2020   GFRNONAA 47 (L) 05/23/2021   GFRAA 46 (L) 06/27/2020    Lab Results  Component Value Date   WBC 6.0 07/03/2024   NEUTROABS 6.5 12/27/2016   HGB 13.3 07/03/2024   HCT 39.7 07/03/2024   MCV 93.6 07/03/2024   PLT 221 07/03/2024     STUDIES: No results found.   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 PET scan results from June 06, 2024 reviewed independently reported a response to therapy of right cervical nodal mass, but PET scan appears to have occurred prior to initiating XRT.  No other hypermetabolic lesions were noted.  Given her advanced age and stage of lymphoma, patient proceeded directly to XRT which she completed on approximately July 24, 2024.  No further intervention is needed.  Return to clinic in 3 months with repeat imaging and further evaluation.   I spent a total of 30 minutes reviewing chart data, face-to-face evaluation with the patient, counseling and coordination of care as detailed above.   Patient expressed understanding and was in agreement with this plan. She also understands that She can call clinic at any time with any questions, concerns, or complaints.    Cancer Staging  Diffuse large B-cell  lymphoma [facility]) Staging form: Hodgkin and Non-Hodgkin Lymphoma, AJCC 8th Edition - Clinical stage from 05/20/2024: Stage II (Diffuse large B-cell lymphoma) - Signed by Jacobo Evalene PARAS, MD on 05/20/2024 Stage prefix: Initial diagnosis   Evalene PARAS Jacobo, MD   07/30/2024 3:12 PM

## 2024-07-31 ENCOUNTER — Encounter: Payer: Self-pay | Admitting: Radiation Oncology

## 2024-07-31 ENCOUNTER — Encounter: Payer: Self-pay | Admitting: Oncology

## 2024-08-04 ENCOUNTER — Other Ambulatory Visit: Payer: Self-pay | Admitting: Radiation Oncology

## 2024-08-04 NOTE — Telephone Encounter (Signed)
.  sw

## 2024-09-01 ENCOUNTER — Encounter: Payer: Self-pay | Admitting: Radiation Oncology

## 2024-09-01 ENCOUNTER — Ambulatory Visit
Admission: RE | Admit: 2024-09-01 | Discharge: 2024-09-01 | Disposition: A | Discharge status: Home / Self Care | Source: Ambulatory Visit | Attending: Radiation Oncology | Admitting: Radiation Oncology

## 2024-09-01 VITALS — BP 112/69 | HR 76 | Temp 97.0°F | Resp 15

## 2024-09-01 DIAGNOSIS — C833A Diffuse large b-cell lymphoma, in remission: Secondary | ICD-10-CM

## 2024-09-01 NOTE — Progress Notes (Signed)
 Radiation Oncology Follow up Note  Name: Brittney Trujillo   Date:   09/01/2024 MRN:  [id number] DOB: 12-Apr-1927    This 89 y.o. female presents to the clinic today for 1 month follow-up status post involved field radiation therapy for diffuse large B-cell lymphoma of the head and neck.[name]  REFERRING PROVIDER: Lenon Layman ORN, MD  HPI: Patient is a 89 year old female now out 1 month having completed involved field radiation therapy to her head and neck for diffuse large B-cell lymphoma.  She is seen today in routine follow-up is doing well she still has some decreased taste which is expected.  She specifically denies any head and neck pain or dysphagia..  She is currently under observation by oncology.  COMPLICATIONS OF TREATMENT: none  FOLLOW UP COMPLIANCE: keeps appointments   PHYSICAL EXAM:  BP 112/69   Pulse 76   Temp (!) 97 F (36.1 C)   Resp 15   LMP  (LMP Unknown)  Oral cavity is clear to oral mucosal lesions are identified.  No evidence of adenopathy in the head and neck region is noted.  No axillary or supraclavicular adenopathy is appreciated.  Well-developed well-nourished patient in NAD. HEENT reveals PERLA, EOMI, discs not visualized.  Oral cavity is clear. No oral mucosal lesions are identified. Neck is clear without evidence of cervical or supraclavicular adenopathy. Lungs are clear to A&P. Cardiac examination is essentially unremarkable with regular rate and rhythm without murmur rub or thrill. Abdomen is benign with no organomegaly or masses noted. Motor sensory and DTR levels are equal and symmetric in the upper and lower extremities. Cranial nerves II through XII are grossly intact. Proprioception is intact. No peripheral adenopathy or edema is identified. No motor or sensory levels are noted. Crude visual fields are within normal range.  RADIOLOGY RESULTS: Will follow any CT scans performed in the near future.  PLAN: At this time I have asked to see her back in 4  months for a one-time follow-up visit.  Hopefully will have CT scans of the head and neck to review at that time.  I will then turn follow-up care over to medical oncology.  Patient knows to call with any concerns at any time she appears to be doing well status post head and neck radiation.  I would like to take this opportunity to thank you for allowing me to participate in the care of your patient.[name] Marcey Penton, MD

## 2024-10-16 ENCOUNTER — Telehealth: Payer: Self-pay | Admitting: Oncology

## 2024-10-16 NOTE — Telephone Encounter (Signed)
 Called pt to confirm CT for 1/12 - pt spouse answered phone and confirmed appt date/time/location - [facility]

## 2024-10-20 ENCOUNTER — Ambulatory Visit
Admission: RE | Admit: 2024-10-20 | Discharge: 2024-10-20 | Disposition: A | Discharge status: Home / Self Care | Source: Ambulatory Visit | Attending: Oncology | Admitting: Oncology

## 2024-10-20 DIAGNOSIS — C8331 Diffuse large B-cell lymphoma, lymph nodes of head, face, and neck: Secondary | ICD-10-CM | POA: Insufficient documentation

## 2024-10-20 MED ORDER — SODIUM CHLORIDE 0.9 % IV SOLN: INTRAVENOUS | Status: DC

## 2024-10-20 MED ORDER — IOHEXOL 300 MG/ML  SOLN
75.0000 mL | Freq: Once | INTRAMUSCULAR | Status: AC | PRN
  Administered 2024-10-20: 75 mL via INTRAVENOUS

## 2024-10-27 ENCOUNTER — Inpatient Hospital Stay: Attending: Oncology | Admitting: Oncology

## 2024-10-27 ENCOUNTER — Encounter: Payer: Self-pay | Admitting: Oncology

## 2024-10-27 VITALS — BP 121/66 | HR 83 | Temp 97.0°F | Resp 18 | Wt 140.0 lb

## 2024-10-27 DIAGNOSIS — Z923 Personal history of irradiation: Secondary | ICD-10-CM | POA: Diagnosis not present

## 2024-10-27 DIAGNOSIS — Z8572 Personal history of non-Hodgkin lymphomas: Secondary | ICD-10-CM | POA: Diagnosis present

## 2024-10-27 DIAGNOSIS — C8331 Diffuse large B-cell lymphoma, lymph nodes of head, face, and neck: Secondary | ICD-10-CM

## 2024-10-27 NOTE — Progress Notes (Unsigned)
 " [facility]  [phone]) [phone] Fax:(336) [phone]  ID: Brittney Trujillo OB: 1927-05-22  MR#: [id number]  RDW#:[id number]  Patient Care Team: Lenon Layman ORN, MD as PCP - General (Internal Medicine) Jacobo Evalene PARAS, MD as Consulting Physician (Oncology) Lenn Aran, MD as Consulting Physician (Radiation Oncology)  CHIEF COMPLAINT: Diffuse large B-cell lymphoma.  INTERVAL HISTORY: Patient returns to clinic today for further evaluation and discussion of her imaging results.  She continues to have dry mouth from her XRT, but otherwise feels well.  She has no neurologic complaints.  She denies any recent fevers or illnesses.  She has a fair appetite, but denies weight loss.  She has no chest pain, shortness of breath, cough, or hemoptysis.  She denies any nausea, vomiting, constipation, or diarrhea.  She has no urinary complaints.  Patient offers no further specific complaints today.  REVIEW OF SYSTEMS:   Review of Systems  Constitutional: Negative.  Negative for fever, malaise/fatigue and weight loss.  Respiratory: Negative.  Negative for cough, hemoptysis and shortness of breath.   Cardiovascular: Negative.  Negative for chest pain and leg swelling.  Gastrointestinal: Negative.  Negative for abdominal pain.  Genitourinary: Negative.  Negative for dysuria.  Musculoskeletal: Negative.  Negative for back pain.  Skin: Negative.  Negative for rash.  Neurological: Negative.  Negative for dizziness, focal weakness, weakness and headaches.  Psychiatric/Behavioral: Negative.  The patient is not nervous/anxious.     As per HPI. Otherwise, a complete review of systems is negative.  PAST MEDICAL HISTORY: Past Medical History:  Diagnosis Date   Anemia    Anginal pain    COPD (chronic obstructive pulmonary disease) (HCC)    GERD (gastroesophageal reflux disease)     PAST SURGICAL HISTORY: Past Surgical History:  Procedure Laterality Date   anti reflux  surgery  1995   CHOLECYSTECTOMY  1992   Lap chole   hysterectomy  1991    FAMILY HISTORY: Family History  Problem Relation Age of Onset   Dementia Mother    Stroke Mother    Stroke Father     ADVANCED DIRECTIVES (Y/N):  N  HEALTH MAINTENANCE: Social History   Tobacco Use   Smoking status: Never   Smokeless tobacco: Never  Substance Use Topics   Alcohol use: No   Drug use: No     Colonoscopy:  PAP:  Bone density:  Lipid panel:  Allergies  Allergen Reactions   Albuterol Other (See Comments)    Patient states this medication makes her have a jerking movements and very shaky.   Codeine Nausea And Vomiting    Current Outpatient Medications  Medication Sig Dispense Refill   acetaminophen  (TYLENOL ) 500 MG tablet Take 500 mg by mouth every 6 (six) hours as needed. (Patient not taking: Reported on 10/27/2024)     Calcium  Carbonate-Vitamin D  600-400 MG-UNIT tablet Take 1 tablet by mouth 2 (two) times daily. (Patient not taking: Reported on 10/27/2024)     denosumab  (PROLIA ) 60 MG/ML SOLN injection Inject 60 mg into the skin every 6 (six) months. (Patient not taking: Reported on 10/27/2024)     desloratadine (CLARINEX) 5 MG tablet Take 5 mg by mouth daily. (Patient not taking: Reported on 10/27/2024)     diltiazem  (DILACOR XR ) 120 MG 24 hr capsule Take 120 mg by mouth daily. (Patient not taking: Reported on 10/27/2024)     estrogens , conjugated, (PREMARIN ) 0.625 MG tablet Take 0.625 mg by mouth daily.  (Patient not taking: Reported on 10/27/2024)  FLUoxetine  (PROZAC ) 10 MG capsule Take 10 mg by mouth every morning. (Patient not taking: Reported on 10/27/2024)     NEXIUM 40 MG capsule Take 40 mg by mouth 2 (two) times daily. (Patient not taking: Reported on 10/27/2024)  0   nitroGLYCERIN  (NITROSTAT ) 0.4 MG SL tablet Place 0.4 mg under the tongue every 5 (five) minutes as needed for chest pain. (Patient not taking: Reported on 10/27/2024)     omeprazole (PRILOSEC) 20 MG capsule Take 20  mg by mouth daily. (Patient not taking: Reported on 10/27/2024)     simvastatin  (ZOCOR ) 40 MG tablet Take 40 mg by mouth at bedtime. (Patient not taking: Reported on 10/27/2024)     sucralfate  (CARAFATE ) 1 g tablet TAKE 1 TABLET(1 GRAM) BY MOUTH THREE TIMES DAILY BEFORE MEALS (Patient not taking: Reported on 10/27/2024) 90 tablet 0   No current facility-administered medications for this visit.    OBJECTIVE: Vitals:   10/27/24 1513  BP: 121/66  Pulse: 83  Resp: 18  Temp: (!) 97 F (36.1 C)  SpO2: 98%     Body mass index is 23.3 kg/m.    ECOG FS:0 - Asymptomatic  General: Well-developed, well-nourished, no acute distress.  Sitting in a wheelchair. Eyes: Pink conjunctiva, anicteric sclera. HEENT: Normocephalic, moist mucous membranes.  No palpable lymphadenopathy. Lungs: No audible wheezing or coughing. Heart: Regular rate and rhythm. Abdomen: Soft, nontender, no obvious distention. Musculoskeletal: No edema, cyanosis, or clubbing. Neuro: Alert, answering all questions appropriately. Cranial nerves grossly intact. Skin: No rashes or petechiae noted. Psych: Normal affect.  LAB RESULTS:  Lab Results  Component Value Date   NA 139 05/23/2021   K 3.9 05/23/2021   CL 105 05/23/2021   CO2 26 05/23/2021   GLUCOSE 164 (H) 05/23/2021   BUN 18 05/23/2021   CREATININE 1.11 (H) 05/23/2021   CALCIUM  8.9 05/23/2021   PROT 6.9 06/27/2020   ALBUMIN 3.4 (L) 06/27/2020   AST 24 06/27/2020   ALT 17 06/27/2020   ALKPHOS 47 06/27/2020   BILITOT 0.9 06/27/2020   GFRNONAA 47 (L) 05/23/2021   GFRAA 46 (L) 06/27/2020    Lab Results  Component Value Date   WBC 6.0 07/03/2024   NEUTROABS 6.5 12/27/2016   HGB 13.3 07/03/2024   HCT 39.7 07/03/2024   MCV 93.6 07/03/2024   PLT 221 07/03/2024     STUDIES: CT SOFT TISSUE NECK W CONTRAST Result Date: 10/24/2024 EXAM: CT NECK WITH CONTRAST 10/20/2024 12:54:27 PM TECHNIQUE: CT of the neck was performed with the administration of 75 mL of  iohexol  (OMNIPAQUE ) 300 MG/ML solution. Multiplanar reformatted images are provided for review. Automated exposure control, iterative reconstruction, and/or weight based adjustment of the mA/kV was utilized to reduce the radiation dose to as low as reasonably achievable. COMPARISON: CT Neck with contrast 04/18/2024. CLINICAL HISTORY: Hematologic malignancy, monitor; neck swelling/lymphnodes. B cell lymphoma. Status post radiation therapy to the head and neck. FINDINGS: AERODIGESTIVE TRACT: No discrete mass. No edema. SALIVARY GLANDS: The parotid and submandibular glands are unremarkable. THYROID: Unremarkable. LYMPH NODES: Previously noted right level 2 lymph node is near completely resolved, measuring 5 mm. A 2 mm hyperdense node is present just medial to this lesion. No new or enlarging nodes are present. SOFT TISSUES: No mass or fluid collection. BONES: High level degenerative changes in the cervical spine are stable. Moderate foraminal narrowing bilaterally at C3-C4 secondary to facet spurring. OTHER: Visualized sinuses and mastoid air cells are well aerated. Visualized lungs are clear. Bilateral lens replacements are  noted. The globes and orbits are otherwise within normal limits. Atherosclerotic changes are again noted at the carotid bifurcations without focal stenosis relative to the more distal levels. IMPRESSION: 1. Near complete resolution of the previously noted right level II lymph node, now measuring 5 mm, with an adjacent 2 mm hyperdense node, and no new or enlarging cervical lymph nodes. Electronically signed by: Lonni Necessary MD 10/24/2024 03:39 PM EST RP Workstation: [id number]     ASSESSMENT: Diffuse large B-cell lymphoma of right tonsil.  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  Patient completed XRT on approximately July 24, 2024.  Her most recent imaging with CT scan on October 24, 2024 reviewed  independently and report as above with no obvious evidence of recurrent or progressive disease.  No intervention is needed at this time.  Return to clinic in 6 months with repeat imaging and further evaluation.    I spent a total of 20 minutes reviewing chart data, face-to-face evaluation with the patient, counseling and coordination of care as detailed above.   Patient expressed understanding and was in agreement with this plan. She also understands that She can call clinic at any time with any questions, concerns, or complaints.    Cancer Staging  Diffuse large B-cell lymphoma (HCC) Staging form: Hodgkin and Non-Hodgkin Lymphoma, AJCC 8th Edition - Clinical stage from 05/20/2024: Stage II (Diffuse large B-cell lymphoma) - Signed by Jacobo Evalene PARAS, MD on 05/20/2024 Stage prefix: Initial diagnosis   Evalene PARAS Jacobo, MD   10/28/2024 7:32 AM     "

## 2024-10-28 ENCOUNTER — Encounter: Payer: Self-pay | Admitting: Oncology

## 2024-10-30 ENCOUNTER — Ambulatory Visit

## 2025-01-01 ENCOUNTER — Ambulatory Visit: Admitting: Radiation Oncology

## 2025-04-28 ENCOUNTER — Ambulatory Visit

## 2025-05-06 ENCOUNTER — Inpatient Hospital Stay: Admitting: Oncology
